# Patient Record
Sex: Female | Born: 1980 | Race: Black or African American | Hispanic: No | Marital: Married | Smoking: Never smoker
Health system: Southern US, Community
[De-identification: ages and names within clinical notes are randomized; demographics above are authoritative.]

## PROBLEM LIST (undated history)

## (undated) DIAGNOSIS — R519 Headache, unspecified: Secondary | ICD-10-CM

## (undated) DIAGNOSIS — F419 Anxiety disorder, unspecified: Secondary | ICD-10-CM

## (undated) DIAGNOSIS — B009 Herpesviral infection, unspecified: Secondary | ICD-10-CM

## (undated) HISTORY — DX: Anxiety disorder, unspecified: F41.9

## (undated) HISTORY — DX: Headache, unspecified: R51.9

---

## 2011-10-13 HISTORY — PX: EYE SURGERY: SHX253

## 2019-09-25 DIAGNOSIS — J339 Nasal polyp, unspecified: Secondary | ICD-10-CM | POA: Diagnosis not present

## 2019-09-25 DIAGNOSIS — R0981 Nasal congestion: Secondary | ICD-10-CM | POA: Diagnosis not present

## 2019-10-12 DIAGNOSIS — Z01419 Encounter for gynecological examination (general) (routine) without abnormal findings: Secondary | ICD-10-CM | POA: Diagnosis not present

## 2019-10-12 DIAGNOSIS — Z3202 Encounter for pregnancy test, result negative: Secondary | ICD-10-CM | POA: Diagnosis not present

## 2019-10-12 DIAGNOSIS — Z30432 Encounter for removal of intrauterine contraceptive device: Secondary | ICD-10-CM | POA: Diagnosis not present

## 2019-10-12 DIAGNOSIS — R635 Abnormal weight gain: Secondary | ICD-10-CM | POA: Diagnosis not present

## 2019-10-20 DIAGNOSIS — R0981 Nasal congestion: Secondary | ICD-10-CM | POA: Diagnosis not present

## 2019-10-20 DIAGNOSIS — J343 Hypertrophy of nasal turbinates: Secondary | ICD-10-CM | POA: Diagnosis not present

## 2019-10-20 DIAGNOSIS — J339 Nasal polyp, unspecified: Secondary | ICD-10-CM | POA: Diagnosis not present

## 2019-10-26 DIAGNOSIS — N871 Moderate cervical dysplasia: Secondary | ICD-10-CM | POA: Diagnosis not present

## 2019-10-26 DIAGNOSIS — D069 Carcinoma in situ of cervix, unspecified: Secondary | ICD-10-CM | POA: Diagnosis not present

## 2019-11-15 DIAGNOSIS — D069 Carcinoma in situ of cervix, unspecified: Secondary | ICD-10-CM | POA: Diagnosis not present

## 2020-01-01 DIAGNOSIS — R0981 Nasal congestion: Secondary | ICD-10-CM | POA: Diagnosis not present

## 2020-01-01 DIAGNOSIS — J019 Acute sinusitis, unspecified: Secondary | ICD-10-CM | POA: Diagnosis not present

## 2020-01-01 DIAGNOSIS — J339 Nasal polyp, unspecified: Secondary | ICD-10-CM | POA: Diagnosis not present

## 2020-01-01 DIAGNOSIS — J343 Hypertrophy of nasal turbinates: Secondary | ICD-10-CM | POA: Diagnosis not present

## 2020-02-16 DIAGNOSIS — Z20828 Contact with and (suspected) exposure to other viral communicable diseases: Secondary | ICD-10-CM | POA: Diagnosis not present

## 2020-02-16 DIAGNOSIS — Z03818 Encounter for observation for suspected exposure to other biological agents ruled out: Secondary | ICD-10-CM | POA: Diagnosis not present

## 2020-09-02 DIAGNOSIS — Z124 Encounter for screening for malignant neoplasm of cervix: Secondary | ICD-10-CM | POA: Diagnosis not present

## 2020-09-03 DIAGNOSIS — M9903 Segmental and somatic dysfunction of lumbar region: Secondary | ICD-10-CM | POA: Diagnosis not present

## 2020-09-03 DIAGNOSIS — R202 Paresthesia of skin: Secondary | ICD-10-CM | POA: Diagnosis not present

## 2020-09-03 DIAGNOSIS — M25531 Pain in right wrist: Secondary | ICD-10-CM | POA: Diagnosis not present

## 2020-09-03 DIAGNOSIS — M9906 Segmental and somatic dysfunction of lower extremity: Secondary | ICD-10-CM | POA: Diagnosis not present

## 2020-09-03 DIAGNOSIS — M25532 Pain in left wrist: Secondary | ICD-10-CM | POA: Diagnosis not present

## 2020-09-03 DIAGNOSIS — M546 Pain in thoracic spine: Secondary | ICD-10-CM | POA: Diagnosis not present

## 2020-09-03 DIAGNOSIS — M9905 Segmental and somatic dysfunction of pelvic region: Secondary | ICD-10-CM | POA: Diagnosis not present

## 2020-09-03 DIAGNOSIS — M9907 Segmental and somatic dysfunction of upper extremity: Secondary | ICD-10-CM | POA: Diagnosis not present

## 2020-09-03 DIAGNOSIS — M9902 Segmental and somatic dysfunction of thoracic region: Secondary | ICD-10-CM | POA: Diagnosis not present

## 2020-09-03 DIAGNOSIS — M9901 Segmental and somatic dysfunction of cervical region: Secondary | ICD-10-CM | POA: Diagnosis not present

## 2020-09-04 ENCOUNTER — Ambulatory Visit
Admission: RE | Admit: 2020-09-04 | Discharge: 2020-09-04 | Disposition: A | Discharge status: Home / Self Care | Payer: BC Managed Care – PPO | Source: Ambulatory Visit | Attending: Chiropractic Medicine | Admitting: Chiropractic Medicine

## 2020-09-04 ENCOUNTER — Other Ambulatory Visit: Payer: Self-pay | Admitting: Chiropractic Medicine

## 2020-09-04 ENCOUNTER — Other Ambulatory Visit: Payer: Self-pay

## 2020-09-04 DIAGNOSIS — M546 Pain in thoracic spine: Secondary | ICD-10-CM | POA: Diagnosis not present

## 2020-09-04 DIAGNOSIS — M542 Cervicalgia: Secondary | ICD-10-CM

## 2020-09-04 DIAGNOSIS — M25532 Pain in left wrist: Secondary | ICD-10-CM

## 2020-09-04 DIAGNOSIS — M25531 Pain in right wrist: Secondary | ICD-10-CM

## 2020-09-04 DIAGNOSIS — R102 Pelvic and perineal pain: Secondary | ICD-10-CM | POA: Diagnosis not present

## 2020-09-04 DIAGNOSIS — M25559 Pain in unspecified hip: Secondary | ICD-10-CM | POA: Diagnosis not present

## 2020-09-04 DIAGNOSIS — M549 Dorsalgia, unspecified: Secondary | ICD-10-CM

## 2020-09-04 DIAGNOSIS — M545 Low back pain, unspecified: Secondary | ICD-10-CM | POA: Diagnosis not present

## 2020-09-09 DIAGNOSIS — M9907 Segmental and somatic dysfunction of upper extremity: Secondary | ICD-10-CM | POA: Diagnosis not present

## 2020-09-09 DIAGNOSIS — M9901 Segmental and somatic dysfunction of cervical region: Secondary | ICD-10-CM | POA: Diagnosis not present

## 2020-09-09 DIAGNOSIS — M546 Pain in thoracic spine: Secondary | ICD-10-CM | POA: Diagnosis not present

## 2020-09-09 DIAGNOSIS — M9906 Segmental and somatic dysfunction of lower extremity: Secondary | ICD-10-CM | POA: Diagnosis not present

## 2020-09-09 DIAGNOSIS — M79641 Pain in right hand: Secondary | ICD-10-CM | POA: Diagnosis not present

## 2020-09-09 DIAGNOSIS — M9905 Segmental and somatic dysfunction of pelvic region: Secondary | ICD-10-CM | POA: Diagnosis not present

## 2020-09-09 DIAGNOSIS — M25531 Pain in right wrist: Secondary | ICD-10-CM | POA: Diagnosis not present

## 2020-09-09 DIAGNOSIS — M9902 Segmental and somatic dysfunction of thoracic region: Secondary | ICD-10-CM | POA: Diagnosis not present

## 2020-09-09 DIAGNOSIS — M5414 Radiculopathy, thoracic region: Secondary | ICD-10-CM | POA: Diagnosis not present

## 2020-09-10 DIAGNOSIS — F4323 Adjustment disorder with mixed anxiety and depressed mood: Secondary | ICD-10-CM | POA: Diagnosis not present

## 2020-09-11 DIAGNOSIS — M79641 Pain in right hand: Secondary | ICD-10-CM | POA: Diagnosis not present

## 2020-09-11 DIAGNOSIS — M9901 Segmental and somatic dysfunction of cervical region: Secondary | ICD-10-CM | POA: Diagnosis not present

## 2020-09-11 DIAGNOSIS — M25531 Pain in right wrist: Secondary | ICD-10-CM | POA: Diagnosis not present

## 2020-09-11 DIAGNOSIS — M5414 Radiculopathy, thoracic region: Secondary | ICD-10-CM | POA: Diagnosis not present

## 2020-09-11 DIAGNOSIS — M9902 Segmental and somatic dysfunction of thoracic region: Secondary | ICD-10-CM | POA: Diagnosis not present

## 2020-09-11 DIAGNOSIS — M9907 Segmental and somatic dysfunction of upper extremity: Secondary | ICD-10-CM | POA: Diagnosis not present

## 2020-09-11 DIAGNOSIS — M9903 Segmental and somatic dysfunction of lumbar region: Secondary | ICD-10-CM | POA: Diagnosis not present

## 2020-09-11 DIAGNOSIS — M546 Pain in thoracic spine: Secondary | ICD-10-CM | POA: Diagnosis not present

## 2020-09-11 DIAGNOSIS — M9906 Segmental and somatic dysfunction of lower extremity: Secondary | ICD-10-CM | POA: Diagnosis not present

## 2020-09-12 ENCOUNTER — Other Ambulatory Visit: Payer: Self-pay

## 2020-09-12 ENCOUNTER — Other Ambulatory Visit: Payer: Self-pay | Admitting: Chiropractic Medicine

## 2020-09-12 ENCOUNTER — Ambulatory Visit
Admission: RE | Admit: 2020-09-12 | Discharge: 2020-09-12 | Disposition: A | Discharge status: Home / Self Care | Payer: BC Managed Care – PPO | Source: Ambulatory Visit | Attending: Chiropractic Medicine | Admitting: Chiropractic Medicine

## 2020-09-12 DIAGNOSIS — M50121 Cervical disc disorder at C4-C5 level with radiculopathy: Secondary | ICD-10-CM | POA: Diagnosis not present

## 2020-09-12 DIAGNOSIS — M5412 Radiculopathy, cervical region: Secondary | ICD-10-CM

## 2020-09-16 DIAGNOSIS — M9903 Segmental and somatic dysfunction of lumbar region: Secondary | ICD-10-CM | POA: Diagnosis not present

## 2020-09-16 DIAGNOSIS — M25531 Pain in right wrist: Secondary | ICD-10-CM | POA: Diagnosis not present

## 2020-09-16 DIAGNOSIS — M9902 Segmental and somatic dysfunction of thoracic region: Secondary | ICD-10-CM | POA: Diagnosis not present

## 2020-09-16 DIAGNOSIS — M9901 Segmental and somatic dysfunction of cervical region: Secondary | ICD-10-CM | POA: Diagnosis not present

## 2020-09-16 DIAGNOSIS — M9907 Segmental and somatic dysfunction of upper extremity: Secondary | ICD-10-CM | POA: Diagnosis not present

## 2020-09-16 DIAGNOSIS — M9906 Segmental and somatic dysfunction of lower extremity: Secondary | ICD-10-CM | POA: Diagnosis not present

## 2020-09-16 DIAGNOSIS — M79641 Pain in right hand: Secondary | ICD-10-CM | POA: Diagnosis not present

## 2020-09-18 DIAGNOSIS — F4323 Adjustment disorder with mixed anxiety and depressed mood: Secondary | ICD-10-CM | POA: Diagnosis not present

## 2020-09-25 DIAGNOSIS — F4323 Adjustment disorder with mixed anxiety and depressed mood: Secondary | ICD-10-CM | POA: Diagnosis not present

## 2020-11-27 ENCOUNTER — Other Ambulatory Visit (HOSPITAL_COMMUNITY)
Admission: RE | Admit: 2020-11-27 | Discharge: 2020-11-27 | Disposition: A | Discharge status: Home / Self Care | Payer: BC Managed Care – PPO | Source: Ambulatory Visit | Attending: Family Medicine | Admitting: Family Medicine

## 2020-11-27 ENCOUNTER — Ambulatory Visit (INDEPENDENT_AMBULATORY_CARE_PROVIDER_SITE_OTHER): Payer: BC Managed Care – PPO | Admitting: Family Medicine

## 2020-11-27 ENCOUNTER — Encounter: Payer: Self-pay | Admitting: Family Medicine

## 2020-11-27 ENCOUNTER — Other Ambulatory Visit: Payer: Self-pay

## 2020-11-27 VITALS — BP 128/80 | HR 51 | Temp 98.7°F | Ht 62.0 in | Wt 168.0 lb

## 2020-11-27 DIAGNOSIS — Z113 Encounter for screening for infections with a predominantly sexual mode of transmission: Secondary | ICD-10-CM | POA: Diagnosis not present

## 2020-11-27 DIAGNOSIS — Z1159 Encounter for screening for other viral diseases: Secondary | ICD-10-CM | POA: Diagnosis not present

## 2020-11-27 DIAGNOSIS — Z0001 Encounter for general adult medical examination with abnormal findings: Secondary | ICD-10-CM

## 2020-11-27 DIAGNOSIS — Z114 Encounter for screening for human immunodeficiency virus [HIV]: Secondary | ICD-10-CM

## 2020-11-27 DIAGNOSIS — Z1329 Encounter for screening for other suspected endocrine disorder: Secondary | ICD-10-CM | POA: Diagnosis not present

## 2020-11-27 DIAGNOSIS — Z23 Encounter for immunization: Secondary | ICD-10-CM | POA: Diagnosis not present

## 2020-11-27 DIAGNOSIS — Z13220 Encounter for screening for lipoid disorders: Secondary | ICD-10-CM | POA: Diagnosis not present

## 2020-11-27 DIAGNOSIS — Z6830 Body mass index (BMI) 30.0-30.9, adult: Secondary | ICD-10-CM

## 2020-11-27 DIAGNOSIS — Z13228 Encounter for screening for other metabolic disorders: Secondary | ICD-10-CM | POA: Diagnosis not present

## 2020-11-27 DIAGNOSIS — Z130 Encounter for screening for diseases of the blood and blood-forming organs and certain disorders involving the immune mechanism: Secondary | ICD-10-CM | POA: Diagnosis not present

## 2020-11-27 DIAGNOSIS — Z3189 Encounter for other procreative management: Secondary | ICD-10-CM

## 2020-11-27 DIAGNOSIS — Z1231 Encounter for screening mammogram for malignant neoplasm of breast: Secondary | ICD-10-CM

## 2020-11-27 NOTE — Patient Instructions (Signed)
Take BP daily at home Let me know if > 130/80   Hypertension, Adult Hypertension is another name for high blood pressure. High blood pressure forces your heart to work harder to pump blood. This can cause problems over time. There are two numbers in a blood pressure reading. There is a top number (systolic) over a bottom number (diastolic). It is best to have a blood pressure that is below 120/80. Healthy choices can help lower your blood pressure, or you may need medicine to help lower it. What are the causes? The cause of this condition is not known. Some conditions may be related to high blood pressure. What increases the risk?  Smoking.  Having type 2 diabetes mellitus, high cholesterol, or both.  Not getting enough exercise or physical activity.  Being overweight.  Having too much fat, sugar, calories, or salt (sodium) in your diet.  Drinking too much alcohol.  Having long-term (chronic) kidney disease.  Having a family history of high blood pressure.  Age. Risk increases with age.  Race. You may be at higher risk if you are African American.  Gender. Men are at higher risk than women before age 26. After age 76, women are at higher risk than men.  Having obstructive sleep apnea.  Stress. What are the signs or symptoms?  High blood pressure may not cause symptoms. Very high blood pressure (hypertensive crisis) may cause: ? Headache. ? Feelings of worry or nervousness (anxiety). ? Shortness of breath. ? Nosebleed. ? A feeling of being sick to your stomach (nausea). ? Throwing up (vomiting). ? Changes in how you see. ? Very bad chest pain. ? Seizures. How is this treated?  This condition is treated by making healthy lifestyle changes, such as: ? Eating healthy foods. ? Exercising more. ? Drinking less alcohol.  Your health care provider may prescribe medicine if lifestyle changes are not enough to get your blood pressure under control, and if: ? Your top  number is above 130. ? Your bottom number is above 80.  Your personal target blood pressure may vary. Follow these instructions at home: Eating and drinking  If told, follow the DASH eating plan. To follow this plan: ? Fill one half of your plate at each meal with fruits and vegetables. ? Fill one fourth of your plate at each meal with whole grains. Whole grains include whole-wheat pasta, brown rice, and whole-grain bread. ? Eat or drink low-fat dairy products, such as skim milk or low-fat yogurt. ? Fill one fourth of your plate at each meal with low-fat (lean) proteins. Low-fat proteins include fish, chicken without skin, eggs, beans, and tofu. ? Avoid fatty meat, cured and processed meat, or chicken with skin. ? Avoid pre-made or processed food.  Eat less than 1,500 mg of salt each day.  Do not drink alcohol if: ? Your doctor tells you not to drink. ? You are pregnant, may be pregnant, or are planning to become pregnant.  If you drink alcohol: ? Limit how much you use to:  0-1 drink a day for women.  0-2 drinks a day for men. ? Be aware of how much alcohol is in your drink. In the [place] one drink equals one 12 oz bottle of beer (355 mL), one 5 oz glass of wine (148 mL), or one 1 oz glass of hard liquor (44 mL).   Lifestyle  Work with your doctor to stay at a healthy weight or to lose weight. Ask your doctor what the best  weight is for you.  Get at least 30 minutes of exercise most days of the week. This may include walking, swimming, or biking.  Get at least 30 minutes of exercise that strengthens your muscles (resistance exercise) at least 3 days a week. This may include lifting weights or doing Pilates.  Do not use any products that contain nicotine or tobacco, such as cigarettes, e-cigarettes, and chewing tobacco. If you need help quitting, ask your doctor.  Check your blood pressure at home as told by your doctor.  Keep all follow-up visits as told by your doctor. This  is important.   Medicines  Take over-the-counter and prescription medicines only as told by your doctor. Follow directions carefully.  Do not skip doses of blood pressure medicine. The medicine does not work as well if you skip doses. Skipping doses also puts you at risk for problems.  Ask your doctor about side effects or reactions to medicines that you should watch for. Contact a doctor if you:  Think you are having a reaction to the medicine you are taking.  Have headaches that keep coming back (recurring).  Feel dizzy.  Have swelling in your ankles.  Have trouble with your vision. Get help right away if you:  Get a very bad headache.  Start to feel mixed up (confused).  Feel weak or numb.  Feel faint.  Have very bad pain in your: ? Chest. ? Belly (abdomen).  Throw up more than once.  Have trouble breathing. Summary  Hypertension is another name for high blood pressure.  High blood pressure forces your heart to work harder to pump blood.  For most people, a normal blood pressure is less than 120/80.  Making healthy choices can help lower blood pressure. If your blood pressure does not get lower with healthy choices, you may need to take medicine. This information is not intended to replace advice given to you by your health care provider. Make sure you discuss any questions you have with your health care provider. Document Revised: 06/08/2018 Document Reviewed: 06/08/2018 Elsevier Patient Education  2021 [employer].

## 2020-11-27 NOTE — Progress Notes (Addendum)
2/16/20224:14 PM  Francisca Langenderfer 03/13/1981, 40 y.o., female [id number]  Chief Complaint  Patient presents with  . Establish Care    Last physical 5 yrs ago , voices no concerns     HPI:   Patient is a 40 y.o. female with past medical history significant for abnormal pap who presents today for new patient eval.  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 TETANUS/TDAP  Never done  . INFLUENZA VACCINE  01/09/2021 (Originally 05/12/2020)  . PAP SMEAR-Modifier  11/06/2023  . COVID-19 Vaccine  Completed  . Hepatitis C Screening  Completed  . HIV Screening  Completed     Depression screen [facility] 2/9 11/27/2020  Decreased Interest 0  Down, Depressed, Hopeless 0  PHQ - 2 Score 0    Fall Risk  11/27/2020  Falls in the past year? 0  Number falls in past yr: 0  Injury with Fall? 0  Follow up Falls evaluation completed     Not on File  Prior to Admission medications   Medication Sig Start Date End Date Taking? Authorizing Provider  Ascorbic Acid (VITAMIN C) 1000 MG tablet Take 1,000 mg by mouth daily.   Yes [provider]  Calcium-Magnesium-Zinc (CALCIUM-MAGNESUIUM-ZINC PO) Take by mouth.   Yes [provider]  cholecalciferol (VITAMIN D3) 25 MCG (1000 UNIT) tablet Take 1,000 Units by mouth daily.   Yes [provider]  NON FORMULARY Supplement Quuercetin   Yes [provider]    History reviewed. No pertinent past medical history.  Past Surgical History:  Procedure Laterality Date  . EYE SURGERY  2013   cyst removal left eye    Social History   Tobacco  Use  . Smoking status: Never Smoker  . Smokeless tobacco: Never Used  Substance Use Topics  . Alcohol use: Not Currently    History reviewed. No pertinent family history.  Review of Systems  Constitutional: Negative for chills, fever and malaise/fatigue.  Eyes: Negative for blurred vision and double vision.  Respiratory: Negative for cough, shortness of breath and wheezing.   Cardiovascular: Negative for chest pain, palpitations and leg swelling.  Gastrointestinal: Negative for abdominal pain, blood in stool, constipation, diarrhea, heartburn, nausea and vomiting.  Genitourinary: Negative for dysuria, frequency and hematuria.  Musculoskeletal: Negative for back pain and joint pain.  Skin: Negative for rash.  Neurological: Negative for dizziness, weakness and headaches.     OBJECTIVE:  Today's Vitals   11/27/20 1536  BP: (!) 150/99  Pulse: (!) 51  Temp: 98.7 F (37.1 C)  SpO2: 100%  Weight: 168 lb (76.2 kg)  Height: _0  (1.575 m)   Body mass index is 30.73 kg/m.   Physical Exam Constitutional:      General: She is not in acute distress.    Appearance: Normal appearance. She is not ill-appearing.  HENT:     Head: Normocephalic.  Cardiovascular:     Rate and Rhythm: Normal rate and regular rhythm.     Pulses: Normal pulses.     Heart sounds: Normal heart sounds. No murmur heard. No friction rub. No gallop.  Pulmonary:     Effort: Pulmonary effort is normal. No respiratory distress.     Breath sounds: Normal breath sounds. No stridor. No wheezing, rhonchi or rales.  Abdominal:     General: Bowel sounds are normal.     Palpations: Abdomen is soft.     Tenderness: There is no abdominal tenderness.  Musculoskeletal:     Right lower leg: No edema.     Left lower leg: No edema.  Skin:    General: Skin is warm and dry.  Neurological:     Mental Status: She is alert and oriented to person, place, and time.  Psychiatric:        Mood and Affect: Mood normal.         Behavior: Behavior normal.     No results found for this or any previous visit (from the past 24 hour(s)).  No results found.   ASSESSMENT and PLAN  Problem List Items Addressed This Visit   None   Visit Diagnoses    Screening for endocrine, metabolic and immunity disorder    -  Primary   Relevant Orders   CBC with Differential   TSH   Hemoglobin A1c   Vitamin D, 25-hydroxy   BMI 30.0-30.9,adult       Encounter for vaccination       Relevant Orders   Tdap vaccine greater than or equal to 7yo IM   Screening, lipid       Relevant Orders   CMP14+EGFR   Lipid Panel   Screening for HIV (human immunodeficiency virus)       Relevant Orders   HIV Antibody (routine testing w rflx)   Encounter for hepatitis C screening test for low risk patient       Relevant Orders   HCV Ab w/Rflx to Verification   Encounter for fertility planning       Relevant Orders   Ambulatory referral to Endocrinology   Screening mammogram for breast cancer       Relevant Orders   MM DIAG BREAST TOMO BILATERAL   Routine screening for STI (sexually transmitted infection)       Relevant Orders   RPR   Urine cytology ancillary only       Plan Take BP daily at home,Let me know if > 130/80 Declines wanting to start BP at this time , r/se/b discussed Will follow up with lab results Order placed for mammogram to get when turns 40 Referral placed to [facility]    Return in about 3 months (around 02/24/2021).    Huston Foley Kaitlynd Phillips, FNP-BC [facility] at [facility] [place], [place] [address] Ph.  [phone] Fax [phone]

## 2020-11-28 ENCOUNTER — Telehealth: Payer: Self-pay

## 2020-11-28 DIAGNOSIS — Z113 Encounter for screening for infections with a predominantly sexual mode of transmission: Secondary | ICD-10-CM | POA: Diagnosis not present

## 2020-11-28 LAB — CMP14+EGFR
ALT: 13 IU/L (ref 0–32)
AST: 19 IU/L (ref 0–40)
Albumin/Globulin Ratio: 1.5 (ref 1.2–2.2)
Albumin: 4.2 g/dL (ref 3.8–4.8)
Alkaline Phosphatase: 56 IU/L (ref 44–121)
BUN/Creatinine Ratio: 8 — ABNORMAL LOW (ref 9–23)
BUN: 6 mg/dL (ref 6–20)
Bilirubin Total: 0.2 mg/dL (ref 0.0–1.2)
CO2: 22 mmol/L (ref 20–29)
Calcium: 9.6 mg/dL (ref 8.7–10.2)
Chloride: 103 mmol/L (ref 96–106)
Creatinine, Ser: 0.8 mg/dL (ref 0.57–1.00)
GFR calc Af Amer: 107 mL/min/{1.73_m2} (ref >59)
GFR calc non Af Amer: 93 mL/min/{1.73_m2} (ref >59)
Globulin, Total: 2.8 g/dL (ref 1.5–4.5)
Glucose: 86 mg/dL (ref 65–99)
Potassium: 4.2 mmol/L (ref 3.5–5.2)
Sodium: 141 mmol/L (ref 134–144)
Total Protein: 7 g/dL (ref 6.0–8.5)

## 2020-11-28 LAB — CBC WITH DIFFERENTIAL/PLATELET
Basophils Absolute: 0.1 10*3/uL (ref 0.0–0.2)
Basos: 1 %
EOS (ABSOLUTE): 0.2 10*3/uL (ref 0.0–0.4)
Eos: 3 %
Hematocrit: 40 % (ref 34.0–46.6)
Hemoglobin: 13.1 g/dL (ref 11.1–15.9)
Immature Grans (Abs): 0 10*3/uL (ref 0.0–0.1)
Immature Granulocytes: 0 %
Lymphocytes Absolute: 3.6 10*3/uL — ABNORMAL HIGH (ref 0.7–3.1)
Lymphs: 56 %
MCH: 29.2 pg (ref 26.6–33.0)
MCHC: 32.8 g/dL (ref 31.5–35.7)
MCV: 89 fL (ref 79–97)
Monocytes Absolute: 0.2 10*3/uL (ref 0.1–0.9)
Monocytes: 4 %
Neutrophils Absolute: 2.3 10*3/uL (ref 1.4–7.0)
Neutrophils: 36 %
Platelets: 238 10*3/uL (ref 150–450)
RBC: 4.48 x10E6/uL (ref 3.77–5.28)
RDW: 12.2 % (ref 11.7–15.4)
WBC: 6.3 10*3/uL (ref 3.4–10.8)

## 2020-11-28 LAB — HCV INTERPRETATION

## 2020-11-28 LAB — LIPID PANEL
Chol/HDL Ratio: 3.6 ratio (ref 0.0–4.4)
Cholesterol, Total: 204 mg/dL — ABNORMAL HIGH (ref 100–199)
HDL: 57 mg/dL (ref >39)
LDL Chol Calc (NIH): 129 mg/dL — ABNORMAL HIGH (ref 0–99)
Triglycerides: 99 mg/dL (ref 0–149)
VLDL Cholesterol Cal: 18 mg/dL (ref 5–40)

## 2020-11-28 LAB — HIV ANTIBODY (ROUTINE TESTING W REFLEX): HIV Screen 4th Generation wRfx: NONREACTIVE

## 2020-11-28 LAB — HEMOGLOBIN A1C
Est. average glucose Bld gHb Est-mCnc: 103 mg/dL
Hgb A1c MFr Bld: 5.2 % (ref 4.8–5.6)

## 2020-11-28 LAB — TSH: TSH: 3.31 u[IU]/mL (ref 0.450–4.500)

## 2020-11-28 LAB — RPR: RPR Ser Ql: NONREACTIVE

## 2020-11-28 LAB — HCV AB W/RFLX TO VERIFICATION: HCV Ab: <0.1 s/co ratio (ref 0.0–0.9)

## 2020-11-28 LAB — VITAMIN D 25 HYDROXY (VIT D DEFICIENCY, FRACTURES): Vit D, 25-Hydroxy: 26.2 ng/mL — ABNORMAL LOW (ref 30.0–100.0)

## 2020-11-28 NOTE — Progress Notes (Signed)
Overall your labs look good. Your Vitamin D continues to be low. Continue your OTC supplement with food daily. Your cholesterol levels are also slightly elevated. No medications are needed at this time, but continue to work on improving your diet and increasing exercise as able. Let me know if you would like a referral to a dietician to further discuss this.

## 2020-11-29 LAB — URINE CYTOLOGY ANCILLARY ONLY
Bacterial Vaginitis-Urine: NEGATIVE
Candida Urine: NEGATIVE
Chlamydia: NEGATIVE
Comment: NEGATIVE
Comment: NEGATIVE
Comment: NORMAL
Neisseria Gonorrhea: NEGATIVE
Trichomonas: NEGATIVE

## 2020-11-29 NOTE — Progress Notes (Signed)
STI testing negative

## 2020-12-23 DIAGNOSIS — Z20822 Contact with and (suspected) exposure to covid-19: Secondary | ICD-10-CM | POA: Diagnosis not present

## 2021-02-27 ENCOUNTER — Ambulatory Visit: Payer: BC Managed Care – PPO | Admitting: Family Medicine

## 2021-04-19 DIAGNOSIS — Z331 Pregnant state, incidental: Secondary | ICD-10-CM | POA: Diagnosis not present

## 2021-05-01 DIAGNOSIS — Z3201 Encounter for pregnancy test, result positive: Secondary | ICD-10-CM | POA: Diagnosis not present

## 2021-05-01 DIAGNOSIS — Z3480 Encounter for supervision of other normal pregnancy, unspecified trimester: Secondary | ICD-10-CM | POA: Diagnosis not present

## 2021-05-01 DIAGNOSIS — N925 Other specified irregular menstruation: Secondary | ICD-10-CM | POA: Diagnosis not present

## 2021-05-16 DIAGNOSIS — Z3480 Encounter for supervision of other normal pregnancy, unspecified trimester: Secondary | ICD-10-CM | POA: Diagnosis not present

## 2021-05-16 DIAGNOSIS — Z369 Encounter for antenatal screening, unspecified: Secondary | ICD-10-CM | POA: Diagnosis not present

## 2021-05-16 DIAGNOSIS — Z3490 Encounter for supervision of normal pregnancy, unspecified, unspecified trimester: Secondary | ICD-10-CM | POA: Diagnosis not present

## 2021-05-16 DIAGNOSIS — Z113 Encounter for screening for infections with a predominantly sexual mode of transmission: Secondary | ICD-10-CM | POA: Diagnosis not present

## 2021-05-16 LAB — OB RESULTS CONSOLE RUBELLA ANTIBODY, IGM: Rubella: IMMUNE

## 2021-05-16 LAB — OB RESULTS CONSOLE HEPATITIS B SURFACE ANTIGEN: Hepatitis B Surface Ag: NEGATIVE

## 2021-05-16 LAB — OB RESULTS CONSOLE GC/CHLAMYDIA
Chlamydia: NEGATIVE
Gonorrhea: NEGATIVE

## 2021-05-16 LAB — OB RESULTS CONSOLE ANTIBODY SCREEN: Antibody Screen: NEGATIVE

## 2021-05-16 LAB — OB RESULTS CONSOLE ABO/RH: RH Type: POSITIVE

## 2021-05-16 LAB — OB RESULTS CONSOLE HIV ANTIBODY (ROUTINE TESTING): HIV: NONREACTIVE

## 2021-05-16 LAB — HEPATITIS C ANTIBODY: HCV Ab: NEGATIVE

## 2021-05-16 LAB — OB RESULTS CONSOLE RPR: RPR: NONREACTIVE

## 2021-06-02 ENCOUNTER — Other Ambulatory Visit: Payer: Self-pay | Admitting: Obstetrics

## 2021-06-02 DIAGNOSIS — Z363 Encounter for antenatal screening for malformations: Secondary | ICD-10-CM

## 2021-06-12 DIAGNOSIS — Z369 Encounter for antenatal screening, unspecified: Secondary | ICD-10-CM | POA: Diagnosis not present

## 2021-07-08 ENCOUNTER — Encounter: Payer: Self-pay | Admitting: *Deleted

## 2021-07-09 ENCOUNTER — Ambulatory Visit: Payer: BC Managed Care – PPO | Attending: Obstetrics and Gynecology

## 2021-07-09 ENCOUNTER — Ambulatory Visit: Payer: BC Managed Care – PPO | Admitting: *Deleted

## 2021-07-09 ENCOUNTER — Encounter: Payer: Self-pay | Admitting: *Deleted

## 2021-07-09 ENCOUNTER — Other Ambulatory Visit: Payer: Self-pay

## 2021-07-09 VITALS — BP 111/58 | HR 75

## 2021-07-09 DIAGNOSIS — O09512 Supervision of elderly primigravida, second trimester: Secondary | ICD-10-CM

## 2021-07-09 DIAGNOSIS — Z363 Encounter for antenatal screening for malformations: Secondary | ICD-10-CM

## 2021-07-10 ENCOUNTER — Other Ambulatory Visit: Payer: Self-pay | Admitting: *Deleted

## 2021-07-10 DIAGNOSIS — O09522 Supervision of elderly multigravida, second trimester: Secondary | ICD-10-CM

## 2021-07-10 DIAGNOSIS — Z362 Encounter for other antenatal screening follow-up: Secondary | ICD-10-CM

## 2021-07-10 DIAGNOSIS — Z6830 Body mass index (BMI) 30.0-30.9, adult: Secondary | ICD-10-CM

## 2021-07-17 DIAGNOSIS — Z23 Encounter for immunization: Secondary | ICD-10-CM | POA: Diagnosis not present

## 2021-08-08 ENCOUNTER — Ambulatory Visit: Payer: BC Managed Care – PPO | Attending: Obstetrics and Gynecology

## 2021-08-08 ENCOUNTER — Ambulatory Visit: Payer: BC Managed Care – PPO | Admitting: *Deleted

## 2021-08-08 ENCOUNTER — Encounter: Payer: Self-pay | Admitting: *Deleted

## 2021-08-08 ENCOUNTER — Other Ambulatory Visit: Payer: Self-pay

## 2021-08-08 VITALS — BP 114/68 | HR 69

## 2021-08-08 DIAGNOSIS — E669 Obesity, unspecified: Secondary | ICD-10-CM | POA: Diagnosis not present

## 2021-08-08 DIAGNOSIS — O09522 Supervision of elderly multigravida, second trimester: Secondary | ICD-10-CM | POA: Diagnosis not present

## 2021-08-08 DIAGNOSIS — Z362 Encounter for other antenatal screening follow-up: Secondary | ICD-10-CM | POA: Insufficient documentation

## 2021-08-08 DIAGNOSIS — Z3A23 23 weeks gestation of pregnancy: Secondary | ICD-10-CM

## 2021-08-08 DIAGNOSIS — O99212 Obesity complicating pregnancy, second trimester: Secondary | ICD-10-CM | POA: Diagnosis not present

## 2021-08-08 DIAGNOSIS — O09512 Supervision of elderly primigravida, second trimester: Secondary | ICD-10-CM | POA: Insufficient documentation

## 2021-08-08 DIAGNOSIS — Z6830 Body mass index (BMI) 30.0-30.9, adult: Secondary | ICD-10-CM | POA: Insufficient documentation

## 2021-08-14 DIAGNOSIS — Z369 Encounter for antenatal screening, unspecified: Secondary | ICD-10-CM | POA: Diagnosis not present

## 2021-09-11 DIAGNOSIS — Z369 Encounter for antenatal screening, unspecified: Secondary | ICD-10-CM | POA: Diagnosis not present

## 2021-09-11 DIAGNOSIS — Z3480 Encounter for supervision of other normal pregnancy, unspecified trimester: Secondary | ICD-10-CM | POA: Diagnosis not present

## 2021-10-12 NOTE — L&D Delivery Note (Signed)
Delivery Note   Pt reached complete dilation and pushed very well for about 30 minutes and at 12:25 AM a healthy female was delivered via Vaginal, Spontaneous (Presentation: Right Occiput Anterior).  APGAR: 9, 9; weight  .   Placenta status: Spontaneous, Intact.  Cord: 3 vessels with the following complications: Nuchal x 1  Anesthesia: Epidural Episiotomy: None Lacerations: 2nd degree;Perineal;Periurethral Suture Repair: 3.0 vicryl rapide Est. Blood Loss (mL): 320  Mom to postpartum.  Baby to Couplet care / Skin to Skin.  D/w pt circumcision and they desire to proceed.  Oliver Pila 12/03/2021, 1:06 AM

## 2021-11-05 LAB — OB RESULTS CONSOLE GBS: GBS: NEGATIVE

## 2021-11-25 ENCOUNTER — Telehealth (HOSPITAL_COMMUNITY): Payer: Self-pay | Admitting: *Deleted

## 2021-11-25 NOTE — Telephone Encounter (Signed)
Preadmission screen  

## 2021-11-26 ENCOUNTER — Other Ambulatory Visit: Payer: Self-pay | Admitting: Advanced Practice Midwife

## 2021-11-27 ENCOUNTER — Inpatient Hospital Stay (HOSPITAL_COMMUNITY): Payer: BC Managed Care – PPO

## 2021-11-28 ENCOUNTER — Other Ambulatory Visit: Payer: Self-pay | Admitting: Obstetrics and Gynecology

## 2021-11-28 ENCOUNTER — Encounter (HOSPITAL_COMMUNITY): Payer: Self-pay | Admitting: *Deleted

## 2021-11-28 ENCOUNTER — Telehealth (HOSPITAL_COMMUNITY): Payer: Self-pay | Admitting: *Deleted

## 2021-11-28 NOTE — Telephone Encounter (Signed)
Preadmission screen  

## 2021-12-01 ENCOUNTER — Encounter (HOSPITAL_COMMUNITY): Payer: Self-pay | Admitting: Obstetrics and Gynecology

## 2021-12-01 ENCOUNTER — Inpatient Hospital Stay (HOSPITAL_COMMUNITY): Payer: BC Managed Care – PPO

## 2021-12-01 ENCOUNTER — Inpatient Hospital Stay (HOSPITAL_COMMUNITY)
Admission: AD | Admit: 2021-12-01 | Discharge: 2021-12-04 | DRG: 807 | Disposition: A | Discharge status: Home / Self Care | Payer: BC Managed Care – PPO | Attending: Obstetrics and Gynecology | Admitting: Obstetrics and Gynecology

## 2021-12-01 DIAGNOSIS — Z20822 Contact with and (suspected) exposure to covid-19: Secondary | ICD-10-CM | POA: Diagnosis present

## 2021-12-01 DIAGNOSIS — Z3A40 40 weeks gestation of pregnancy: Secondary | ICD-10-CM | POA: Diagnosis not present

## 2021-12-01 DIAGNOSIS — O09513 Supervision of elderly primigravida, third trimester: Secondary | ICD-10-CM | POA: Diagnosis present

## 2021-12-01 DIAGNOSIS — Z3493 Encounter for supervision of normal pregnancy, unspecified, third trimester: Secondary | ICD-10-CM | POA: Diagnosis present

## 2021-12-01 HISTORY — DX: Herpesviral infection, unspecified: B00.9

## 2021-12-01 LAB — CBC
HCT: 34.8 % — ABNORMAL LOW (ref 36.0–46.0)
Hemoglobin: 12.1 g/dL (ref 12.0–15.0)
MCH: 29.4 pg (ref 26.0–34.0)
MCHC: 34.8 g/dL (ref 30.0–36.0)
MCV: 84.7 fL (ref 80.0–100.0)
Platelets: 159 10*3/uL (ref 150–400)
RBC: 4.11 MIL/uL (ref 3.87–5.11)
RDW: 14.6 % (ref 11.5–15.5)
WBC: 5.7 10*3/uL (ref 4.0–10.5)
nRBC: 0 % (ref 0.0–0.2)

## 2021-12-01 LAB — RESP PANEL BY RT-PCR (FLU A&B, COVID) ARPGX2
Influenza A by PCR: NEGATIVE
Influenza B by PCR: NEGATIVE
SARS Coronavirus 2 by RT PCR: NEGATIVE

## 2021-12-01 LAB — TYPE AND SCREEN
ABO/RH(D): O POS
Antibody Screen: NEGATIVE

## 2021-12-01 MED ORDER — OXYTOCIN BOLUS FROM INFUSION
333.0000 mL | Freq: Once | INTRAVENOUS | Status: AC
  Administered 2021-12-03: 333 mL via INTRAVENOUS

## 2021-12-01 MED ORDER — MISOPROSTOL 25 MCG QUARTER TABLET
25.0000 ug | ORAL_TABLET | ORAL | Status: DC | PRN
  Administered 2021-12-01 – 2021-12-02 (×4): 25 ug via VAGINAL
  Filled 2021-12-01 (×6): qty 1

## 2021-12-01 MED ORDER — SOD CITRATE-CITRIC ACID 500-334 MG/5ML PO SOLN: 30.0000 mL | ORAL | Status: DC | PRN

## 2021-12-01 MED ORDER — TERBUTALINE SULFATE 1 MG/ML IJ SOLN: 0.2500 mg | Freq: Once | INTRAMUSCULAR | Status: DC | PRN

## 2021-12-01 MED ORDER — ACETAMINOPHEN 325 MG PO TABS: 650.0000 mg | ORAL_TABLET | ORAL | Status: DC | PRN

## 2021-12-01 MED ORDER — ONDANSETRON HCL 4 MG/2ML IJ SOLN
4.0000 mg | Freq: Four times a day (QID) | INTRAMUSCULAR | Status: DC | PRN
  Administered 2021-12-03: 4 mg via INTRAVENOUS
  Filled 2021-12-01: qty 2

## 2021-12-01 MED ORDER — LACTATED RINGERS IV SOLN: 500.0000 mL | INTRAVENOUS | Status: DC | PRN

## 2021-12-01 MED ORDER — LIDOCAINE HCL (PF) 1 % IJ SOLN: 30.0000 mL | INTRAMUSCULAR | Status: DC | PRN

## 2021-12-01 MED ORDER — OXYCODONE-ACETAMINOPHEN 5-325 MG PO TABS: 2.0000 | ORAL_TABLET | ORAL | Status: DC | PRN

## 2021-12-01 MED ORDER — OXYCODONE-ACETAMINOPHEN 5-325 MG PO TABS: 1.0000 | ORAL_TABLET | ORAL | Status: DC | PRN

## 2021-12-01 MED ORDER — FENTANYL CITRATE (PF) 100 MCG/2ML IJ SOLN: 50.0000 ug | INTRAMUSCULAR | Status: DC | PRN

## 2021-12-01 MED ORDER — LACTATED RINGERS IV SOLN: INTRAVENOUS | Status: DC

## 2021-12-01 MED ORDER — OXYTOCIN-SODIUM CHLORIDE 30-0.9 UT/500ML-% IV SOLN
2.5000 [IU]/h | INTRAVENOUS | Status: DC
  Administered 2021-12-03: 2.5 [IU]/h via INTRAVENOUS
  Filled 2021-12-01: qty 500

## 2021-12-01 NOTE — H&P (Signed)
Brandi Small is a 41 y.o. female presenting for IOL for AMA  41 yo G1P0 @ 39+5 presents for IOL for AMA. Her pregnancy has been otherwise uncomplicated OB History     Gravida  1   Para      Term      Preterm      AB      Living         SAB      IAB      Ectopic      Multiple      Live Births             Past Medical History:  Diagnosis Date   Anxiety    Headache    HSV (herpes simplex virus) infection    Past Surgical History:  Procedure Laterality Date   EYE SURGERY  2013   cyst removal left eye   Family History: family history is not on file. Social History:  reports that she has never smoked. She has never used smokeless tobacco. She reports that she does not currently use alcohol. She reports that she does not use drugs.     Maternal Diabetes: No Genetic Screening: Declined Maternal Ultrasounds/Referrals: Normal Fetal Ultrasounds or other Referrals:  None Maternal Substance Abuse:  No Significant Maternal Medications:  None Significant Maternal Lab Results:  Group B Strep negative Other Comments:  None  Review of Systems History Dilation: 1 Effacement (%): 30 Station: -3 Exam by:: Mary [place] Johnson, RN Blood pressure 123/76, pulse 75, temperature 97.7 F (36.5 C), temperature source Oral, resp. rate 16, height 5\' 2"  (1.575 m), weight 81.5 kg, last menstrual period 02/16/2021. Exam Physical Exam  Prenatal labs: ABO, Rh: --/--/O POS (02/20 1325) Antibody: NEG (02/20 1325) Rubella: Immune (08/05 0000) RPR: Nonreactive (08/05 0000)  HBsAg: Negative (08/05 0000)  HIV: Non-reactive (08/05 0000)  GBS: Negative/-- (01/25 0000)   Assessment/Plan: 1) Admit 2) Misoprostal [birth year]1982 Q 4 PV 3) Epidural on request   12/01/2021, 4:21 PM

## 2021-12-02 ENCOUNTER — Inpatient Hospital Stay (HOSPITAL_COMMUNITY): Payer: BC Managed Care – PPO | Admitting: Anesthesiology

## 2021-12-02 ENCOUNTER — Other Ambulatory Visit: Payer: Self-pay

## 2021-12-02 LAB — RPR: RPR Ser Ql: NONREACTIVE

## 2021-12-02 MED ORDER — PHENYLEPHRINE 40 MCG/ML (10ML) SYRINGE FOR IV PUSH (FOR BLOOD PRESSURE SUPPORT)
80.0000 ug | PREFILLED_SYRINGE | INTRAVENOUS | Status: AC | PRN
  Administered 2021-12-02 (×3): 80 ug via INTRAVENOUS

## 2021-12-02 MED ORDER — FENTANYL-BUPIVACAINE-NACL 0.5-0.125-0.9 MG/250ML-% EP SOLN
12.0000 mL/h | EPIDURAL | Status: DC | PRN
  Administered 2021-12-02: 12 mL/h via EPIDURAL
  Filled 2021-12-02: qty 250

## 2021-12-02 MED ORDER — LACTATED RINGERS AMNIOINFUSION: INTRAVENOUS | Status: DC

## 2021-12-02 MED ORDER — OXYTOCIN-SODIUM CHLORIDE 30-0.9 UT/500ML-% IV SOLN
1.0000 m[IU]/min | INTRAVENOUS | Status: DC
  Administered 2021-12-02: 2 m[IU]/min via INTRAVENOUS

## 2021-12-02 MED ORDER — LACTATED RINGERS IV SOLN: 500.0000 mL | Freq: Once | INTRAVENOUS | Status: DC

## 2021-12-02 MED ORDER — BUPIVACAINE HCL (PF) 0.25 % IJ SOLN
INTRAMUSCULAR | Status: DC | PRN
  Administered 2021-12-02: 10 mL via EPIDURAL

## 2021-12-02 MED ORDER — PHENYLEPHRINE 40 MCG/ML (10ML) SYRINGE FOR IV PUSH (FOR BLOOD PRESSURE SUPPORT)
80.0000 ug | PREFILLED_SYRINGE | INTRAVENOUS | Status: DC | PRN
  Filled 2021-12-02: qty 10

## 2021-12-02 MED ORDER — EPHEDRINE 5 MG/ML INJ: 10.0000 mg | INTRAVENOUS | Status: DC | PRN

## 2021-12-02 MED ORDER — DIPHENHYDRAMINE HCL 50 MG/ML IJ SOLN: 12.5000 mg | INTRAMUSCULAR | Status: DC | PRN

## 2021-12-02 MED ORDER — LIDOCAINE HCL (PF) 1 % IJ SOLN
INTRAMUSCULAR | Status: DC | PRN
  Administered 2021-12-02: 8 mL via EPIDURAL

## 2021-12-02 NOTE — Progress Notes (Signed)
Patient ID: Brandi Small, female   DOB: May 02, 1981, 41 y.o.   MRN: [id number] Pt was having some repetitive deep variables with contractions and one prolonged.  Responded to position change to high fowler's and amnioinfusion begun.  Now with good variability and only mild intermittent variable decelerations with contractions  Cervix 80/6-7/-1 to 0  Continue to follow progress.

## 2021-12-02 NOTE — Progress Notes (Signed)
Patient ID: Brandi Small, female   DOB: Nov 21, 1980, 41 y.o.   MRN: [id number] Pt comfortable with epidural  Afeb VSS FHR 140 baseline and good variability  Cervix  c/8-9/-1 to 0 station with contraction Continue to follow progress

## 2021-12-02 NOTE — Anesthesia Procedure Notes (Signed)
Epidural Patient location during procedure: OB Start time: 12/02/2021 12:22 PM End time: 12/02/2021 12:29 PM  Staffing Anesthesiologist: Bethena Midget, MD  Preanesthetic Checklist Completed: patient identified, IV checked, site marked, risks and benefits discussed, surgical consent, monitors and equipment checked, pre-op evaluation and timeout performed  Epidural Patient position: sitting Prep: DuraPrep and site prepped and draped Patient monitoring: continuous pulse ox and blood pressure Approach: midline Location: L3-L4 Injection technique: LOR air  Needle:  Needle type: Tuohy  Needle gauge: 17 G Needle length: 9 cm and 9 Needle insertion depth: 5 cm cm Catheter type: closed end flexible Catheter size: 19 Gauge Catheter at skin depth: 13 cm Test dose: negative  Assessment Events: blood not aspirated, injection not painful, no injection resistance, no paresthesia and negative IV test

## 2021-12-02 NOTE — Progress Notes (Signed)
Patient ID: Brandi Small, female   DOB: 02/10/1981, 41 y.o.   MRN: [id number] Late entry note  Pt was given several doses of cytotec overnight and declined a foley balloon When I came on this AM we started pitocin and pt began to feel increasingly intense contractions. I examined her at 1130am and she was 2/70-80/-2, AROM performed with clear fluid noted  She became uncomfortable and got her epidural.  Cervix after epidural was 5/80/-2 at 1300pm  FHR overall category 1 with occasional mild variable decelerations

## 2021-12-02 NOTE — Anesthesia Preprocedure Evaluation (Addendum)
Anesthesia Evaluation  Patient identified by MRN, date of birth, ID band Patient awake    Reviewed: Allergy & Precautions, NPO status , Patient's Chart, lab work & pertinent test results  Airway Mallampati: I       Dental  (+) Teeth Intact, Dental Advisory Given   Pulmonary neg pulmonary ROS,    Pulmonary exam normal        Cardiovascular negative cardio ROS Normal cardiovascular exam     Neuro/Psych negative neurological ROS  negative psych ROS   GI/Hepatic negative GI ROS, Neg liver ROS,   Endo/Other  negative endocrine ROS  Renal/GU negative Renal ROS  negative genitourinary   Musculoskeletal negative musculoskeletal ROS (+)   Abdominal (+) + obese,   Peds  Hematology negative hematology ROS (+)   Anesthesia Other Findings IOL for AMA  Reproductive/Obstetrics (+) Pregnancy                           Anesthesia Physical Anesthesia Plan  ASA: 2  Anesthesia Plan: Epidural   Post-op Pain Management:    Induction:   PONV Risk Score and Plan: Treatment may vary due to age or medical condition  Airway Management Planned: Natural Airway  Additional Equipment: None  Intra-op Plan:   Post-operative Plan:   Informed Consent: I have reviewed the patients History and Physical, chart, labs and discussed the procedure including the risks, benefits and alternatives for the proposed anesthesia with the patient or authorized representative who has indicated his/her understanding and acceptance.       Plan Discussed with: Anesthesiologist and CRNA  Anesthesia Plan Comments: (Patient identified. Risks, benefits, options discussed with patient including but not limited to bleeding, infection, nerve damage, paralysis, failed block, incomplete pain control, headache, blood pressure changes, nausea, vomiting, reactions to medication, itching, and post partum back pain. Confirmed with bedside  nurse the patient's most recent platelet count. Confirmed with the patient that they are not taking any anticoagulation, have any bleeding history or any family history of bleeding disorders. Patient expressed understanding and wishes to proceed. All questions were answered. )       Anesthesia Quick Evaluation

## 2021-12-03 ENCOUNTER — Encounter (HOSPITAL_COMMUNITY): Payer: Self-pay | Admitting: Obstetrics and Gynecology

## 2021-12-03 LAB — CBC
HCT: 30.1 % — ABNORMAL LOW (ref 36.0–46.0)
Hemoglobin: 10.3 g/dL — ABNORMAL LOW (ref 12.0–15.0)
MCH: 29.1 pg (ref 26.0–34.0)
MCHC: 34.2 g/dL (ref 30.0–36.0)
MCV: 85 fL (ref 80.0–100.0)
Platelets: 144 10*3/uL — ABNORMAL LOW (ref 150–400)
RBC: 3.54 MIL/uL — ABNORMAL LOW (ref 3.87–5.11)
RDW: 14.6 % (ref 11.5–15.5)
WBC: 14.6 10*3/uL — ABNORMAL HIGH (ref 4.0–10.5)
nRBC: 0 % (ref 0.0–0.2)

## 2021-12-03 MED ORDER — SENNOSIDES-DOCUSATE SODIUM 8.6-50 MG PO TABS
2.0000 | ORAL_TABLET | ORAL | Status: DC
  Administered 2021-12-03 – 2021-12-04 (×2): 2 via ORAL
  Filled 2021-12-03 (×2): qty 2

## 2021-12-03 MED ORDER — WITCH HAZEL-GLYCERIN EX PADS: 1.0000 "application " | MEDICATED_PAD | CUTANEOUS | Status: DC | PRN

## 2021-12-03 MED ORDER — DIPHENHYDRAMINE HCL 25 MG PO CAPS: 25.0000 mg | ORAL_CAPSULE | Freq: Four times a day (QID) | ORAL | Status: DC | PRN

## 2021-12-03 MED ORDER — TETANUS-DIPHTH-ACELL PERTUSSIS 5-2.5-18.5 LF-MCG/0.5 IM SUSY: 0.5000 mL | PREFILLED_SYRINGE | Freq: Once | INTRAMUSCULAR | Status: DC

## 2021-12-03 MED ORDER — IBUPROFEN 600 MG PO TABS
600.0000 mg | ORAL_TABLET | Freq: Four times a day (QID) | ORAL | Status: DC
  Administered 2021-12-03 – 2021-12-04 (×7): 600 mg via ORAL
  Filled 2021-12-03 (×7): qty 1

## 2021-12-03 MED ORDER — BENZOCAINE-MENTHOL 20-0.5 % EX AERO
1.0000 | INHALATION_SPRAY | CUTANEOUS | Status: DC | PRN
Start: 2021-12-03 — End: 2021-12-05
  Administered 2021-12-03: 1 via TOPICAL
  Filled 2021-12-03: qty 56

## 2021-12-03 MED ORDER — ACETAMINOPHEN 325 MG PO TABS: 650.0000 mg | ORAL_TABLET | ORAL | Status: DC | PRN

## 2021-12-03 MED ORDER — COCONUT OIL OIL: 1.0000 "application " | TOPICAL_OIL | Status: DC | PRN

## 2021-12-03 MED ORDER — ZOLPIDEM TARTRATE 5 MG PO TABS: 5.0000 mg | ORAL_TABLET | Freq: Every evening | ORAL | Status: DC | PRN

## 2021-12-03 MED ORDER — SIMETHICONE 80 MG PO CHEW: 80.0000 mg | CHEWABLE_TABLET | ORAL | Status: DC | PRN

## 2021-12-03 MED ORDER — ONDANSETRON HCL 4 MG PO TABS: 4.0000 mg | ORAL_TABLET | ORAL | Status: DC | PRN

## 2021-12-03 MED ORDER — PRENATAL MULTIVITAMIN CH
1.0000 | ORAL_TABLET | Freq: Every day | ORAL | Status: DC
  Administered 2021-12-03 – 2021-12-04 (×2): 1 via ORAL
  Filled 2021-12-03 (×2): qty 1

## 2021-12-03 MED ORDER — DIBUCAINE (PERIANAL) 1 % EX OINT: 1.0000 "application " | TOPICAL_OINTMENT | CUTANEOUS | Status: DC | PRN

## 2021-12-03 MED ORDER — ONDANSETRON HCL 4 MG/2ML IJ SOLN: 4.0000 mg | INTRAMUSCULAR | Status: DC | PRN

## 2021-12-03 NOTE — Lactation Note (Signed)
This note was copied from a baby's chart. Lactation Consultation Note  Patient Name: Brandi Small [id number] Date: 12/03/2021   Age:41 hours Per RN ( Brandi Small) mom wants to work on latching infant without assistance, she will call if [facility] if she need further latch assistance, she declined for now. Maternal Data    Feeding    LATCH Score                    Lactation Tools Discussed/Used    Interventions    Discharge    Consult Status      Brandi Small 12/03/2021, 11:38 PM

## 2021-12-03 NOTE — Lactation Note (Signed)
This note was copied from a baby's chart. Lactation Consultation Note  Patient Name: Brandi Small [id number] Date: 12/03/2021 Reason for consult: Follow-up assessment;1st time breastfeeding;Primapara;Term Age:41 hours  LC in to visit with P1 Mom of term baby.  Baby has been sleepy, but did latch and breastfeed on and off for 12 mins after delivery.   Baby has had 2 spoon feedings of colostrum.  Mom has an easy flow of colostrum.    LC placed baby STS in football hold.  Mom hand expressed drops of colostrum.  Baby licking and opening his mouth but not wide enough to sustain a deep consistent latch to the breast.  LC initiated a 20 mm nipple shield, but baby too sleepy to latch.  Mom's breasts are compressible with erect nipples.  Mom to continue trying to latch.  Encouraged Mom to keep baby STS on her chest to encourage frequent feedings.  Mom requested baby to be swaddled in his crib so she can eat lunch.   Mom to hand express colostrum into medication cup (LC washed) and Mom will spoon feed colostrum if baby continues to not latch. Feeding Mother's Current Feeding Choice: Breast Milk  LATCH Score Latch: Repeated attempts needed to sustain latch, nipple held in mouth throughout feeding, stimulation needed to elicit sucking reflex.  Audible Swallowing: None  Type of Nipple: Everted at rest and after stimulation  Comfort (Breast/Nipple): Soft / non-tender  Hold (Positioning): Full assist, staff holds infant at breast  LATCH Score: 5  Interventions Interventions: Breast feeding basics reviewed;Assisted with latch;Skin to skin;Breast massage;Hand express;Support pillows;Position options;Expressed milk;Hand pump  Discharge Pump: Personal;DEBP Doy Mince Motif)  Consult Status Consult Status: Follow-up Date: 12/04/21 Follow-up type: In-patient    [place], Ewart 12/03/2021, 3:04 PM

## 2021-12-03 NOTE — Lactation Note (Signed)
This note was copied from a baby's chart. Lactation Consultation Note  Patient Name: Brandi Small [id number] Date: 12/03/2021 Reason for consult: Initial assessment Age:41 hours, P1, term female infant. Mom requested latch assistance tonight. Mom latched infant on her right breast using football hold position, infant was on and off beast ( not sustaining latch) , BF for 12 minutes. Mom was taught hand expression, she self expressed 3 mls of colostrum that was spoon feed to infant. Mom will continue to work towards latching infant at breast and will ask RN/LC for further latch assistance if needed.  Mom knows to breastfeed infant according to hunger cues, skin to skin. Mom knows if infant doesn't latch, she can hand express and give infant back her EBM by spoon.  Mom made aware of O/P services, breastfeeding support groups, community resources, and our phone # for post-discharge questions.   Maternal Data Has patient been taught Hand Expression?: Yes Does the patient have breastfeeding experience prior to this delivery?: No  Feeding Mother's Current Feeding Choice: Breast Milk  LATCH Score Latch: Repeated attempts needed to sustain latch, nipple held in mouth throughout feeding, stimulation needed to elicit sucking reflex.  Audible Swallowing: A few with stimulation  Type of Nipple: Everted at rest and after stimulation  Comfort (Breast/Nipple): Soft / non-tender  Hold (Positioning): Assistance needed to correctly position infant at breast and maintain latch.  LATCH Score: 7   Lactation Tools Discussed/Used    Interventions Interventions: Breast feeding basics reviewed;Assisted with latch;Skin to skin;Hand express;Breast compression;Adjust position;Support pillows;Position options;Expressed milk;Education;LC Services brochure  Discharge    Consult Status Consult Status: Follow-up Date: 12/03/21 Follow-up type: In-patient    Vicente Serene 12/03/2021, 3:35 AM

## 2021-12-03 NOTE — Anesthesia Postprocedure Evaluation (Signed)
Anesthesia Post Note  Patient: Brandi Small  Procedure(s) Performed: AN AD HOC LABOR EPIDURAL     Patient location during evaluation: Mother Baby Anesthesia Type: Epidural Level of consciousness: awake and alert and oriented Pain management: satisfactory to patient Vital Signs Assessment: post-procedure vital signs reviewed and stable Respiratory status: respiratory function stable Cardiovascular status: stable Postop Assessment: no headache, no backache, epidural receding, patient able to bend at knees, no signs of nausea or vomiting and adequate PO intake Anesthetic complications: no   No notable events documented.  Last Vitals:  Vitals:   12/03/21 0240 12/03/21 0340  BP: (!) 144/78 117/84  Pulse: 75 70  Resp: 18 18  Temp: 37 C 37.1 C  SpO2: 100%     Last Pain:  Vitals:   12/03/21 0610  TempSrc:   PainSc: 0-No pain   Pain Goal:                   Brandi Small

## 2021-12-03 NOTE — Lactation Note (Signed)
This note was copied from a baby's chart. Lactation Consultation Note  Patient Name: Brandi Small Date: 12/03/2021 Reason for consult: Follow-up assessment;Primapara;1st time breastfeeding;Term Age:41 hours  LC in to visit with P1 Mom of term baby.  Baby sleeping STS on Mom's chest.  Baby still hasn't latched and fed at the breast.  LC offered to assist.  LC tried laid back position, cross cradle hold, and football holds on both breast.  LC able to hand express drop of colostrum onto nipple.  Baby opening his mouth, but unable to open widely and sustain a deep latch.  Baby sucking on his tongue, suck training done.  Baby has a highly arched palate.  LC initiated a 20 mm nipple shield and baby latched after a few attempts.  Baby latched onto base of nipple shield.  Taught Mom to support firmly her breast.  LC tried pulling down on baby's chin to open his mouth wider.  Lower lips flanged. Baby sucking with deep and deeper jaw extensions. Mom feeling a tug.  After 20 mins, baby came off.  Unable to see any colostrum in the shield, but Mom's nipple was pulled further into shield.  Unable to determine if milk was transferred.  Baby assisted to latch right back onto breast WITHOUT the nipple shield.  Mom feeling a stronger tug and baby relaxed and sucking with deep jaw extensions.  Mom feeling a strong feeling of relaxation (talked about hormonal affects of baby breastfeeding).  DEBP set up at bedside.  Mom instructed to use if baby is latched using the nipple shield.  Mom taught of the importance of disassembling pump parts, washing, rinsing and air drying in separate bin provided.  Plan recommended- 1-STS as much as possible 2- Offer breast with feeding cues.  If baby asleep for 3 hrs, unwrap and place STS on chest to encouraged baby to eat. 3- Mom to double pump on initiation setting if using a nipple shield during the latch. 4- Mom can hand express as well into a spoon for extra  feeding.  Mom knows to ask for help prn.   LATCH Score Latch: Grasps breast easily, tongue down, lips flanged, rhythmical sucking.  Audible Swallowing: Spontaneous and intermittent  Type of Nipple: Everted at rest and after stimulation  Comfort (Breast/Nipple): Soft / non-tender  Hold (Positioning): Assistance needed to correctly position infant at breast and maintain latch.  LATCH Score: 9   Lactation Tools Discussed/Used Tools: Nipple Shields;Pump;Flanges Nipple shield size: 20 Flange Size: 24 Breast pump type: Double-Electric Breast Pump Pump Education: Setup, frequency, and cleaning;Milk Storage Reason for Pumping: Support milk supply/use of nipple shield to help with a deep latch  Interventions Interventions: Assisted with latch;Skin to skin;Breast massage;Hand express;Breast compression;Adjust position;Support pillows;Position options;DEBP Consult Status Consult Status: Follow-up Date: 12/04/21 Follow-up type: In-patient    Brandi Small, Brandi Small 12/03/2021, 6:56 PM

## 2021-12-03 NOTE — Progress Notes (Signed)
PPD #0 No problems Afeb, VSS Fundus firm, NT at U-1 Continue routine postpartum care, will plan for circumcision

## 2021-12-04 NOTE — Lactation Note (Signed)
This note was copied from a baby's chart. Lactation Consultation Note  Patient Name: Brandi Small [id number] Date: 12/04/2021 Reason for consult: Mother's request (Mom requested LC services tonight to assist with latch.) Age:41 hours Per mom, infant has not been sustaining latch, mom latched infant on her right breast using the football hold position, infant was on and off the breast for 10 minutes. Mom applied 20 mm NS that was pre-filled with 0.5 mls of EBM and infant breastfeed for another 20 minutes the total feeding was 30 minutes. Infant was given 9.5 mls of mom's EBM that was finger feed with curve tip syringe that mom had hand expressed. Mom will continue to work on latching infant at the breast and will ask for further latch assistance if needed by RN/LC. Mom plans to use DEBP again and pump for 15 minutes on initial setting.  Mom will continue to use 20 mm NS tonight and will try latch infant without NS tomorrow morning.  Maternal Data    Feeding Mother's Current Feeding Choice: Breast Milk  LATCH Score Latch: Grasps breast easily, tongue down, lips flanged, rhythmical sucking. (Infant sustained latch with 20 mm NS)  Audible Swallowing: Spontaneous and intermittent  Type of Nipple: Inverted  Comfort (Breast/Nipple): Soft / non-tender  Hold (Positioning): Assistance needed to correctly position infant at breast and maintain latch.  LATCH Score: 7   Lactation Tools Discussed/Used Nipple shield size: 20  Interventions Interventions: Adjust position;Support pillows;Breast compression;Position options;Expressed milk;Education;Hand express;Skin to skin;Assisted with latch  Discharge    Consult Status Consult Status: Follow-up Date: 12/04/21 Follow-up type: In-patient    Danelle Earthly 12/04/2021, 1:57 AM

## 2021-12-04 NOTE — Lactation Note (Signed)
This note was copied from a baby's chart. Lactation Consultation Note  Patient Name: Brandi Small Jacobi [id number] Date: 12/04/2021 Reason for consult: Follow-up assessment;Mother's request;Term;Breastfeeding assistance Age:41 hours  LC assisted with latching infant in football with signs of milk transfer. Infant still feeding at the end of the visit.   Mom latching infant at the breast and breastfeeding exclusively without the NS.  Mom aware to feed by cues 8-12x 24hr period.  Infant adequate urine and stool output with stool transitioning to greenish color.   Mom doing well, placed prn. Mom to call for latch assistance if needed.   Maternal Data Has patient been taught Hand Expression?: Yes  Feeding Mother's Current Feeding Choice: Breast Milk  LATCH Score Latch: Repeated attempts needed to sustain latch, nipple held in mouth throughout feeding, stimulation needed to elicit sucking reflex.  Audible Swallowing: Spontaneous and intermittent  Type of Nipple: Everted at rest and after stimulation  Comfort (Breast/Nipple): Soft / non-tender  Hold (Positioning): Assistance needed to correctly position infant at breast and maintain latch.  LATCH Score: 8   Lactation Tools Discussed/Used    Interventions Interventions: Breast feeding basics reviewed;Assisted with latch;Skin to skin;Breast massage;Hand express;Breast compression;Adjust position;Support pillows;Position options;Expressed milk;Education;LC Magazine features editor;Infant Driven Feeding Algorithm education  Discharge Pump: Personal  Consult Status Consult Status: Follow-up Date: 12/05/21 Follow-up type: In-patient    Joselynn Amoroso  Nicholson-Springer 12/04/2021, 1:39 PM
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
## 2021-12-04 NOTE — Discharge Summary (Signed)
Postpartum Discharge Summary  Date of Service updated      Patient Name: Brandi Small DOB: 02/16/1981 MRN: [id number]  Date of admission: 12/01/2021 Delivery date:12/03/2021  Delivering provider: Paula Compton  Date of discharge: 12/04/2021  Admitting diagnosis: Advanced maternal age, 1st pregnancy, third trimester [O09.513] NSVD (normal spontaneous vaginal delivery) [O80] Intrauterine pregnancy: [redacted]w[redacted]d    Secondary diagnosis:  Principal Problem:   Advanced maternal age, 1st pregnancy, third trimester Active Problems:   NSVD (normal spontaneous vaginal delivery)  Additional problems: AMA    Discharge diagnosis: Term Pregnancy Delivered                                              Post partum procedures: none Augmentation: AROM, Pitocin, and Cytotec Complications: None  Hospital course: Induction of Labor With Vaginal Delivery   41y.o. yo G1P1001 at 41w0das admitted to the hospital 12/01/2021 for induction of labor.  Indication for induction: AMA.  Patient had an uncomplicated labor course as follows: Membrane Rupture Time/Date: 11:30 AM ,12/02/2021   Delivery Method:Vaginal, Spontaneous  Episiotomy: None  Lacerations:  2nd degree;Perineal;Periurethral  Details of delivery can be found in separate delivery note.  Patient had a routine postpartum course. Patient is discharged home 12/04/21.  Newborn Data: Birth date:12/03/2021  Birth time:12:25 AM  Gender:Female  Living status:Living  Apgars:9 ,9  Weight:2840 g   Magnesium Sulfate received: No BMZ received: No Rhophylac:No MMR:No T-DaP:Given prenatally Flu: No Transfusion:No  Physical exam  Vitals:   12/03/21 1240 12/03/21 1635 12/03/21 2105 12/04/21 0541  BP: 114/85 110/76 109/78 132/71  Pulse: 75 78 63 69  Resp: _0 Temp: 98.5 F (36.9 C) 97.7 F (36.5 C) 98 F (36.7 C) 98.3 F (36.8 C)  TempSrc: Oral Oral Oral Oral  SpO2: 100% 100%    Weight:      Height:        Labs: Lab Results   Component Value Date   WBC 14.6 (H) 12/03/2021   HGB 10.3 (L) 12/03/2021   HCT 30.1 (L) 12/03/2021   MCV 85.0 12/03/2021   PLT 144 (L) 12/03/2021   CMP Latest Ref Rng & Units 11/27/2020  Glucose 65 - 99 mg/dL 86  BUN 6 - 20 mg/dL 6  Creatinine 0.57 - 1.00 mg/dL 0.80  Sodium 134 - 144 mmol/L 141  Potassium 3.5 - 5.2 mmol/L 4.2  Chloride 96 - 106 mmol/L 103  CO2 20 - 29 mmol/L 22  Calcium 8.7 - 10.2 mg/dL 9.6  Total Protein 6.0 - 8.5 g/dL 7.0  Total Bilirubin 0.0 - 1.2 mg/dL 0.2  Alkaline Phos 44 - 121 IU/L 56  AST 0 - 40 IU/L 19  ALT 0 - 32 IU/L 13   Edinburgh Score: Edinburgh Postnatal Depression Scale Screening Tool 12/03/2021  I have been able to laugh and see the funny side of things. 0  I have looked forward with enjoyment to things. 0  I have blamed myself unnecessarily when things went wrong. 0  I have been anxious or worried for no good reason. 0  I have felt scared or panicky for no good reason. 2  Things have been getting on top of me. 1  I have been so unhappy that I have had difficulty sleeping. 1  I have felt sad or miserable. 1  I have been so  unhappy that I have been crying. 0  The thought of harming myself has occurred to me. 0  Edinburgh Postnatal Depression Scale Total 5      After visit meds:  Allergies as of 12/04/2021   No Known Allergies      Medication List     STOP taking these medications    aspirin EC 81 MG tablet       TAKE these medications    CALCIUM-MAGNESUIUM-ZINC PO Take by mouth.   cholecalciferol 25 MCG (1000 UNIT) tablet Commonly known as: VITAMIN D3 Take 1,000 Units by mouth daily.   NON FORMULARY Supplement Quuercetin   prenatal multivitamin Tabs tablet Take 1 tablet by mouth daily at 12 noon.   vitamin C 1000 MG tablet Take 1,000 mg by mouth daily.         Discharge home in stable condition Infant Feeding:  ? Infant Disposition:home with mother Discharge instruction: per After Visit Summary and  Postpartum booklet. Activity: Advance as tolerated. Pelvic rest for 6 weeks.  Diet: routine diet Anticipated Birth Control: Unsure Postpartum Appointment:4 weeks Additional Postpartum F/U:  none Future Appointments:No future appointments. Follow up Visit:  Follow-up Information     Bobbye Charleston, MD Follow up in 4 week(s).   Specialty: Obstetrics and Gynecology Contact information: [address] [place] [address] [phone]                     12/04/2021 Daria Pastures, MD

## 2021-12-04 NOTE — Progress Notes (Signed)
Patient is eating, ambulating, voiding.  Pain control is good.  Vitals:   12/03/21 1240 12/03/21 1635 12/03/21 2105 12/04/21 0541  BP: 114/85 110/76 109/78 132/71  Pulse: 75 78 63 69  Resp: 18 17 18 18   Temp: 98.5 F (36.9 C) 97.7 F (36.5 C) 98 F (36.7 C) 98.3 F (36.8 C)  TempSrc: Oral Oral Oral Oral  SpO2: 100% 100%    Weight:      Height:        Fundus firm Perineum without swelling.  Lab Results  Component Value Date   WBC 14.6 (H) 12/03/2021   HGB 10.3 (L) 12/03/2021   HCT 30.1 (L) 12/03/2021   MCV 85.0 12/03/2021   PLT 144 (L) 12/03/2021    --/--/O POS (02/20 1325)/RI  A/P Post partum day 1.  Routine care.  Expect d/c routine.   Parents desires circumsision.  All risks, benefits and alternatives discussed with the mother.   12-17-2002

## 2021-12-13 ENCOUNTER — Telehealth (HOSPITAL_COMMUNITY): Payer: Self-pay

## 2021-12-13 NOTE — Telephone Encounter (Signed)
No answer. Left message to return nurse call. ? Heron Nay ?12/13/2021,1222 ?

## 2022-02-12 ENCOUNTER — Ambulatory Visit: Payer: BC Managed Care – PPO | Attending: Obstetrics | Admitting: Physical Therapy

## 2022-02-12 DIAGNOSIS — M6281 Muscle weakness (generalized): Secondary | ICD-10-CM | POA: Diagnosis not present

## 2022-02-12 DIAGNOSIS — R102 Pelvic and perineal pain: Secondary | ICD-10-CM | POA: Insufficient documentation

## 2022-02-12 DIAGNOSIS — R293 Abnormal posture: Secondary | ICD-10-CM | POA: Diagnosis not present

## 2022-02-12 DIAGNOSIS — R279 Unspecified lack of coordination: Secondary | ICD-10-CM | POA: Diagnosis not present

## 2022-02-12 DIAGNOSIS — M62838 Other muscle spasm: Secondary | ICD-10-CM | POA: Insufficient documentation

## 2022-02-12 NOTE — Therapy (Signed)
?OUTPATIENT PHYSICAL THERAPY FEMALE PELVIC EVALUATION ? ? ?Patient Name: Brandi Small ?MRN: [id number] ?DOB:17-Apr-1981, 41 y.o., female ?Today's Date: 02/12/2022 ? ? PT End of Session - 02/12/22 1635   ? ? Visit Number 1   ? Date for PT Re-Evaluation 05/15/22   ? Authorization Type BCBS   ? PT Start Time 1450   arriva time  ? PT Stop Time 1529   ? PT Time Calculation (min) 39 min   ? Activity Tolerance Patient limited by pain   ? Behavior During Therapy [facility] for tasks assessed/performed   ? ?  ?  ? ?  ? ? ?Past Medical History:  ?Diagnosis Date  ? Anxiety   ? Headache   ? HSV (herpes simplex virus) infection   ? ?Past Surgical History:  ?Procedure Laterality Date  ? EYE SURGERY  2013  ? cyst removal left eye  ? ?Patient Active Problem List  ? Diagnosis Date Noted  ? NSVD (normal spontaneous vaginal delivery) 12/03/2021  ? Advanced maternal age, 1st pregnancy, third trimester 12/01/2021  ? ? ?PCP: Just, Azalee Course, FNP ? ?REFERRING PROVIDER: Marlow Baars, MD ? ?REFERRING DIAG: R10.2 (ICD-10-CM) - Pelvic and perineal pain ? ?THERAPY DIAG:  ?Muscle weakness (generalized) - Plan: PT plan of care cert/re-cert ? ?Abnormal posture - Plan: PT plan of care cert/re-cert ? ?Unspecified lack of coordination - Plan: PT plan of care cert/re-cert ? ?Other muscle spasm - Plan: PT plan of care cert/re-cert ? ?ONSET DATE: 10wks ago ? ?SUBJECTIVE:                                                                                                                                                                                          ? ?SUBJECTIVE STATEMENT: ?Pt had vaginal birth 10wks ago and had a second degree during labor. Pt reports she has pain with sneezing, laughing, walking, sitting. Was able to have vaginal assessment yesterday with MD with one digit placed internally and very painful on the Lt side.  ? ?Fluid intake: Yes: a lot of water 2.5-3 L per day   ? ?Patient confirms identification and approves PT to assess pelvic floor  and treatment Yes ? ? ?PAIN:  ?Are you having pain? Yes ?NPRS scale: 2/10 sitting in clinic; but worst is 7/10 with having a BM and urinating and prolonged sitting ?Pain location: Internal and Vaginal ? ?Pain type: pressure ?Pain description: constant  ? ?Aggravating factors: prolonged sitting, while having BM and urinating ?Relieving factors: pain medication was helpful, no longer taking any however  ? ?PRECAUTIONS: Other: postpartum ? ?WEIGHT BEARING RESTRICTIONS No ? ?FALLS:  ?Has patient fallen in last  6 months? No ? ?LIVING ENVIRONMENT: ?Lives with: lives with their family ?Lives in: House/apartment ? ? 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 ?PLOF: Independent ? ?PATIENT GOALS to have less pain ? ?PERTINENT HISTORY:  ?HSV, anxiety, HA ?Sexual abuse: No ? ?BOWEL MOVEMENT ?Pain with bowel movement: Yes ?Type of bowel movement:Type (Bristol Stool Scale) 4, Frequency daily, and Strain No ?Fully empty rectum: Yes:   ?Leakage: No ?Pads: No ?Fiber supplement: No ? ?URINATION ?Pain with urination: Yes ?Fully empty bladder: Yes: with strong urgency feels like she can't hold it long ?Stream: Strong ?Urgency: Yes: consistently ?Frequency: every hour ?Leakage:  no  ?Pads: No ? ?INTERCOURSE ?Pain with intercourse:  not active since delivery  no pain during pregnancy.  ?Ability to have vaginal penetration:  No ? ?Marinoff Scale: 3/3 ? ?PREGNANCY ?Vaginal deliveries 1 ?Tearing Yes: 2 nd degree tear ?C-section deliveries 0 ?Currently pregnant No ? ?PROLAPSE ?After urinating first morning void ? ? ? ?OBJECTIVE:  ? ?DIAGNOSTIC FINDINGS:  ? ?COGNITION: ? Overall cognitive status: Within functional limits for tasks assessed   ?  ?SENSATION: ? Light touch: Appears intact ? Proprioception: Appears intact ? ?MUSCLE LENGTH: ?Bil hamstrings and adductors limited by 25% ? ? ? ?POSTURE:  ?Rounded shoulders, posterior pelvic tilt ? ?LUMBARAROM/PROM ? ?Side bending and flexion bil  decreased by 25% all others WFL ? ?LE ROM: ? ?Bil WFL ? ?LE MMT: ? ?Bil hip abduction 3/5, adduction and extension 3+/5, and flexion 4/5; knees and ankles ? ?PELVIC MMT: ?  ?MMT  ?02/12/2022  ?Vaginal Unable to test due to pain  ?Internal Anal Sphincter   ?External Anal Sphincter   ?Puborectalis   ?Diastasis Recti less than one finger separation at DRA above umbilicus, with good density.   ?(Blank rows = not tested) ? ?      PALPATION: ?  General: fascial restrictions throughout upper and lower abdominal quadrants, TTP at lower Rt quadrant ? ?              External Perineal Exam TTP at Lt side only no pain at Rt at medial glute, bulbocavernosus, ischiocavernosus, and superficial transverse. Poor scar mobility at perineal scar.  ?              ?              Internal Pelvic Floor TTP superficially at bil bulbocavernosus, not progress deeper due to pain.  ? ?TONE: ?Increased  ? ?PROLAPSE: ?Unable to assess  ? ?TODAY'S TREATMENT  ?02/12/2022 EVAL Examination completed, findings reviewed, pt educated on POC, HEP, and *. Pt motivated to participate in PT and agreeable to attempt recommendations.  ? ? ? ?PATIENT EDUCATION:  ?Education details: [id number] ?Person educated: Patient ?Education method: Explanation, Demonstration, Tactile cues, Verbal cues, and Handouts ?Education comprehension: verbalized understanding and returned demonstration ? ? ?HOME EXERCISE PROGRAM: ?[id number] ? ?ASSESSMENT: ? ?CLINICAL IMPRESSION: ?Patient is a 41 y.o. female  who was seen today for physical therapy evaluation and treatment for pain postpartum. Pt is 10 weeks postpartum with first baby and had second degree tear with labor. Pt reports she was having pain with all mobility but now mostly with prolonged forward sitting, BMs and urination.  Pt found to have weakness in bil hips, decreased flexibility in spine and bil hips, decreased core strength, less than one finger separation at DRA above umbilicus. Pt consented to internal vaginal  assessment and pt at pain externally  at Lt side of pelvis, and less than one knuckle length of gloved digit inserted and pt reported pain at Lt side, no additional depth progressed due to pain and pt did have increased tension at bil bulbocavernosus. Pt also had decreased scar mobility and had abdominal fascial restrictions noted. Pt given and educated on HEP. Pt would benefit from additional PT to further address deficits.   ? ? ?OBJECTIVE IMPAIRMENTS decreased coordination, decreased endurance, decreased strength, increased fascial restrictions, increased muscle spasms, impaired flexibility, impaired tone, improper body mechanics, postural dysfunction, and pain.  ? ?ACTIVITY LIMITATIONS cleaning, community activity, driving, yard work, and shopping.  ? ?PERSONAL FACTORS Time since onset of injury/illness/exacerbation and 1 comorbidity: one vaginal birth with 2nd deg tear  are also affecting patient's functional outcome.  ? ? ?REHAB POTENTIAL: Good ? ?CLINICAL DECISION MAKING: Stable/uncomplicated ? ?EVALUATION COMPLEXITY: Low ? ? ?GOALS: ?Goals reviewed with patient? Yes ? ?SHORT TERM GOALS: Target date: 03/12/2022 ? ?Pt to be I with HEP. ?Baseline: ?Goal status: INITIAL ? ?2.  Pt to report no more than 5/10 pain at introitus for improved tolerance to vaginal penetration for medical assessments.   ?Baseline:  ?Goal status: INITIAL ? ?3.  Pt to be I with recall of proper voiding and breathing mechanics to decrease pain during voiding.  ?Baseline:  ?Goal status: INITIAL ? ?4.  Pt to report ability to hold urine for 2 hours without leakage for improved functional tolerance for community outings.  ?Baseline:  ?Goal status: INITIAL ? ? ? ?LONG TERM GOALS: Target date: 05/15/2022 ? ?Pt to be I with advanced HEP. ?Baseline:  ?Goal status: INITIAL ? ?2.  Pt to report no more than 2/10 pain at introitus for improved tolerance to vaginal penetration for medical assessments.  ?Baseline:  ?Goal status: INITIAL ? ?3.  Pt to report  ability to hold urine for 3 hours without leakage for improved functional tolerance for community outings. ?Baseline:  ?Goal status: INITIAL ? ?4. Pt to demonstrate at least 5/5 bil hip strength for improved pe

## 2022-02-26 ENCOUNTER — Ambulatory Visit: Payer: BC Managed Care – PPO | Admitting: Physical Therapy

## 2022-02-26 DIAGNOSIS — M6281 Muscle weakness (generalized): Secondary | ICD-10-CM

## 2022-02-26 DIAGNOSIS — R102 Pelvic and perineal pain: Secondary | ICD-10-CM | POA: Diagnosis not present

## 2022-02-26 DIAGNOSIS — R293 Abnormal posture: Secondary | ICD-10-CM

## 2022-02-26 DIAGNOSIS — M62838 Other muscle spasm: Secondary | ICD-10-CM

## 2022-02-26 NOTE — Therapy (Signed)
OUTPATIENT PHYSICAL THERAPY TREATMENT NOTE   Patient Name: Brandi Small MRN: [id number] DOB:02-04-1981, 41 y.o., female Today's Date: 02/26/2022  PCP: Just, Azalee Course, FNP REFERRING PROVIDER:  Marlow Baars, MD  END OF SESSION:   PT End of Session - 02/26/22 1625     Visit Number 2    Date for PT Re-Evaluation 05/15/22    Authorization Type BCBS    PT Start Time 1623    PT Stop Time 1656    PT Time Calculation (min) 33 min    Activity Tolerance Patient limited by pain    Behavior During Therapy WFL for tasks assessed/performed             Past Medical History:  Diagnosis Date   Anxiety    Headache    HSV (herpes simplex virus) infection    Past Surgical History:  Procedure Laterality Date   EYE SURGERY  2013   cyst removal left eye   Patient Active Problem List   Diagnosis Date Noted   NSVD (normal spontaneous vaginal delivery) 12/03/2021   Advanced maternal age, 1st pregnancy, third trimester 12/01/2021    REFERRING DIAG: R10.2 (ICD-10-CM) - Pelvic and perineal pain  THERAPY DIAG:  Muscle weakness (generalized)  Other muscle spasm  Abnormal posture  Rationale for Evaluation and Treatment Rehabilitation  PERTINENT HISTORY: HSV, anxiety, HA Sexual abuse: No  PRECAUTIONS: Other: postpartum  SUBJECTIVE: Pt reports no longer having pain with BMs, sneezing or laughing, first urination of morning does still have pain but not as bad. Pt reports she doesn't have pain with short distances of walking but did have pain after walking 2 miles with stroller and felt pain at scar tissue site and did have some burning with urination after this as well.   PAIN:  Are you having pain? No   OBJECTIVE: (objective measures completed at initial evaluation unless otherwise dated)   DIAGNOSTIC FINDINGS:    COGNITION:            Overall cognitive status: Within functional limits for tasks assessed                          SENSATION:            Light touch: Appears  intact            Proprioception: Appears intact   MUSCLE LENGTH: Bil hamstrings and adductors limited by 25%       POSTURE:  Rounded shoulders, posterior pelvic tilt   LUMBARAROM/PROM   Side bending and flexion bil decreased by 25% all others WFL   LE ROM:   Bil WFL   LE MMT:   Bil hip abduction 3/5, adduction and extension 3+/5, and flexion 4/5; knees and ankles   PELVIC MMT:   MMT   02/12/2022  Vaginal Unable to test due to pain  Internal Anal Sphincter    External Anal Sphincter    Puborectalis    Diastasis Recti less than one finger separation at DRA above umbilicus, with good density.   (Blank rows = not tested)         PALPATION:   General: fascial restrictions throughout upper and lower abdominal quadrants, TTP at lower Rt quadrant                 External Perineal Exam TTP at Lt side only no pain at Rt at medial glute, bulbocavernosus, ischiocavernosus, and superficial transverse. Poor scar mobility at perineal scar.  Internal Pelvic Floor TTP superficially at bil bulbocavernosus, not progress deeper due to pain.    TONE: Increased    PROLAPSE: Unable to assess    TODAY'S TREATMENT   02/26/2022: Stretching with strap: hamstring, adductor, abductor bil 2x30s each Unilateral Happy baby 2x30s Child's pose/side bending child's pose 2x30s Manual perineal scar massage in Rt and posterior directions very gentle stretching, circles, in Rt and downward directions to pt's tolerance.     02/12/2022 EVAL Examination completed, findings reviewed, pt educated on POC, HEP. Pt motivated to participate in PT and agreeable to attempt recommendations.        PATIENT EDUCATION:  Education details: [id number] Person educated: Patient Education method: Explanation, Demonstration, Tactile cues, Verbal cues, and Handouts Education comprehension: verbalized understanding and returned demonstration     HOME EXERCISE PROGRAM: [id number]    ASSESSMENT:   CLINICAL IMPRESSION: Patient reports she has had improvement with pain overall since eval but still remain with urination and increasing activity. Pt has not attempted vaginal penetration yet so unsure if this painful. Pt session focused on hip and spinal mobility/stretching and manual work at perineal body. Pt tolerated well and demonstrated improved mobility but still have restrictions felt. Pt would benefit from additional PT to further address deficits.       OBJECTIVE IMPAIRMENTS decreased coordination, decreased endurance, decreased strength, increased fascial restrictions, increased muscle spasms, impaired flexibility, impaired tone, improper body mechanics, postural dysfunction, and pain.    ACTIVITY LIMITATIONS cleaning, community activity, driving, yard work, and shopping.    PERSONAL FACTORS Time since onset of injury/illness/exacerbation and 1 comorbidity: one vaginal birth with 2nd deg tear  are also affecting patient's functional outcome.      REHAB POTENTIAL: Good   CLINICAL DECISION MAKING: Stable/uncomplicated   EVALUATION COMPLEXITY: Low     GOALS: Goals reviewed with patient? Yes   SHORT TERM GOALS: Target date: 03/12/2022   Pt to be I with HEP. Baseline: Goal status: INITIAL   2.  Pt to report no more than 5/10 pain at introitus for improved tolerance to vaginal penetration for medical assessments.   Baseline:  Goal status: INITIAL   3.  Pt to be I with recall of proper voiding and breathing mechanics to decrease pain during voiding.  Baseline:  Goal status: INITIAL   4.  Pt to report ability to hold urine for 2 hours without leakage for improved functional tolerance for community outings.  Baseline:  Goal status: INITIAL       LONG TERM GOALS: Target date: 05/15/2022   Pt to be I with advanced HEP. Baseline:  Goal status: INITIAL   2.  Pt to report no more than 2/10 pain at introitus for improved tolerance to vaginal penetration for  medical assessments.  Baseline:  Goal status: INITIAL   3.  Pt to report ability to hold urine for 3 hours without leakage for improved functional tolerance for community outings. Baseline:  Goal status: INITIAL   4. Pt to demonstrate at least 5/5 bil hip strength for improved pelvic stability and functional squats without pain.   Baseline:  Goal status: INITIAL   5.  Pt to demonstrate improved coordination of pelvic floor and breathing mechanics for functional tasks (squatting 20#) without pain for caring for child.  Baseline:  Goal status: INITIAL       PLAN: PT FREQUENCY: 1x/week   PT DURATION:  8 sessions   PLANNED INTERVENTIONS: Therapeutic exercises, Therapeutic activity, Neuromuscular re-education, Patient/Family education, Joint mobilization, Dry Needling, Spinal  mobilization, Cryotherapy, Moist heat, Manual lymph drainage, scar mobilization, Taping, Biofeedback, and Manual therapy   PLAN FOR NEXT SESSION: pelvic relaxation, stretching at hips, core and back, core strengthening   Otelia Sergeant, PT, DPT 05/18/235:06 PM

## 2022-03-03 ENCOUNTER — Ambulatory Visit: Payer: BC Managed Care – PPO | Admitting: Physical Therapy

## 2022-03-03 DIAGNOSIS — M6281 Muscle weakness (generalized): Secondary | ICD-10-CM

## 2022-03-03 DIAGNOSIS — R293 Abnormal posture: Secondary | ICD-10-CM

## 2022-03-03 DIAGNOSIS — R102 Pelvic and perineal pain: Secondary | ICD-10-CM | POA: Diagnosis not present

## 2022-03-03 DIAGNOSIS — R279 Unspecified lack of coordination: Secondary | ICD-10-CM

## 2022-03-03 NOTE — Therapy (Signed)
OUTPATIENT PHYSICAL THERAPY TREATMENT NOTE   Patient Name: Brandi ScoreCaroline Filosa MRN: [id number] DOB:24-Dec-1980, 41 y.o., female Today's Date: 03/03/2022  PCP: Just, Azalee CourseKelsea J, FNP REFERRING PROVIDER:  Marlow Baarslark, Dyanna, MD  END OF SESSION:   PT End of Session - 03/03/22 1635     Visit Number 3    Date for PT Re-Evaluation 05/15/22    Authorization Type [employer]    PT Start Time 1625   pt arrival time   PT Stop Time 1650   pt requested to leave early to return home to baby   PT Time Calculation (min) 25 min    Activity Tolerance Patient limited by pain    [facility] tasks assessed/performed              Past Medical History:  Diagnosis Date   Anxiety    Headache    HSV (herpes simplex virus) infection    Past Surgical History:  Procedure Laterality Date   EYE SURGERY  2013   cyst removal left eye   Patient Active Problem List   Diagnosis Date Noted   NSVD (normal spontaneous vaginal delivery) 12/03/2021   Advanced maternal age, 1st pregnancy, third trimester 12/01/2021    REFERRING DIAG: R10.2 (ICD-10-CM) - Pelvic and perineal pain  THERAPY DIAG:  Muscle weakness (generalized)  Abnormal posture  Unspecified lack of coordination  Rationale for Evaluation and Treatment Rehabilitation  PERTINENT HISTORY: HSV, anxiety, HA Sexual abuse: No  PRECAUTIONS: Other: postpartum  SUBJECTIVE: Pt reports she is feeling much better and was able to have intercourse since last visit without pain but unable to orgasm and having mild soreness. Pt reports she has been doing HEP 2x per day and manual self stretching daily.   PAIN:  Are you having pain? No   OBJECTIVE: (objective measures completed at initial evaluation unless otherwise dated)   DIAGNOSTIC FINDINGS:    COGNITION:            Overall cognitive status: Within functional limits for tasks assessed                          SENSATION:            Light touch: Appears intact             Proprioception: Appears intact   MUSCLE LENGTH: Bil hamstrings and adductors limited by 25%       POSTURE:  Rounded shoulders, posterior pelvic tilt   LUMBARAROM/PROM   Side bending and flexion bil decreased by 25% all others WFL   LE ROM:   Bil WFL   LE MMT:   Bil hip abduction 3/5, adduction and extension 3+/5, and flexion 4/5; knees and ankles   PELVIC MMT:   MMT   02/12/2022  Vaginal Unable to test due to pain  Internal Anal Sphincter    External Anal Sphincter    Puborectalis    Diastasis Recti less than one finger separation at DRA above umbilicus, with good density.   (Blank rows = not tested)         PALPATION:   General: fascial restrictions throughout upper and lower abdominal quadrants, TTP at lower Rt quadrant                 External Perineal Exam TTP at Lt side only no pain at Rt at medial glute, bulbocavernosus, ischiocavernosus, and superficial transverse. Poor scar mobility at perineal scar.  Internal Pelvic Floor TTP superficially at bil bulbocavernosus, not progress deeper due to pain.    TONE: Increased    PROLAPSE: Unable to assess    TODAY'S TREATMENT   03/03/2022:  Updated HEP and reviewed with pt and also discussed attempting gentle pelvic floor contractions as long as there is no pain. Pt able to review and recall well Pt denied internal treatment reporting she was not prepared for this today but agreeable for next session.  Pt requested to end session early as she wanted to return home to baby.   02/26/2022: Stretching with strap: hamstring, adductor, abductor bil 2x30s each Unilateral Happy baby 2x30s Child's pose/side bending child's pose 2x30s Manual perineal scar massage in Rt and posterior directions very gentle stretching, circles, in Rt and downward directions to pt's tolerance.     02/12/2022 EVAL Examination completed, findings reviewed, pt educated on POC, HEP. Pt motivated to participate in PT and  agreeable to attempt recommendations.        PATIENT EDUCATION:  Education details: [id number] Person educated: Patient Education method: Explanation, Demonstration, Tactile cues, Verbal cues, and Handouts Education comprehension: verbalized understanding and returned demonstration     HOME EXERCISE PROGRAM: [id number]   ASSESSMENT:   CLINICAL IMPRESSION: Patient reports she is very pleased about her progress as her pain has gotten much better and she is more mobile and has returned to intercourse. Pt session focused on educating on updated HEP and continuing with gentle stretching. Pt would benefit from additional PT to further address deficits.       OBJECTIVE IMPAIRMENTS decreased coordination, decreased endurance, decreased strength, increased fascial restrictions, increased muscle spasms, impaired flexibility, impaired tone, improper body mechanics, postural dysfunction, and pain.    ACTIVITY LIMITATIONS cleaning, community activity, driving, yard work, and shopping.    PERSONAL FACTORS Time since onset of injury/illness/exacerbation and 1 comorbidity: one vaginal birth with 2nd deg tear  are also affecting patient's functional outcome.      REHAB POTENTIAL: Good   CLINICAL DECISION MAKING: Stable/uncomplicated   EVALUATION COMPLEXITY: Low     GOALS: Goals reviewed with patient? Yes   SHORT TERM GOALS: Target date: 03/12/2022   Pt to be I with HEP. Baseline: Goal status: INITIAL   2.  Pt to report no more than 5/10 pain at introitus for improved tolerance to vaginal penetration for medical assessments.   Baseline:  Goal status: INITIAL   3.  Pt to be I with recall of proper voiding and breathing mechanics to decrease pain during voiding.  Baseline:  Goal status: INITIAL   4.  Pt to report ability to hold urine for 2 hours without leakage for improved functional tolerance for community outings.  Baseline:  Goal status: INITIAL       LONG TERM GOALS: Target date:  05/15/2022   Pt to be I with advanced HEP. Baseline:  Goal status: INITIAL   2.  Pt to report no more than 2/10 pain at introitus for improved tolerance to vaginal penetration for medical assessments.  Baseline:  Goal status: INITIAL   3.  Pt to report ability to hold urine for 3 hours without leakage for improved functional tolerance for community outings. Baseline:  Goal status: INITIAL   4. Pt to demonstrate at least 5/5 bil hip strength for improved pelvic stability and functional squats without pain.   Baseline:  Goal status: INITIAL   5.  Pt to demonstrate improved coordination of pelvic floor and breathing mechanics for functional tasks (squatting 20#)  without pain for caring for child.  Baseline:  Goal status: INITIAL       PLAN: PT FREQUENCY: 1x/week   PT DURATION:  8 sessions   PLANNED INTERVENTIONS: Therapeutic exercises, Therapeutic activity, Neuromuscular re-education, Patient/Family education, Joint mobilization, Dry Needling, Spinal mobilization, Cryotherapy, Moist heat, Manual lymph drainage, scar mobilization, Taping, Biofeedback, and Manual therapy   PLAN FOR NEXT SESSION: pelvic relaxation, stretching at hips, core and back, core strengthening  Otelia Sergeant, PT, DPT 05/23/235:03 PM

## 2022-04-08 ENCOUNTER — Ambulatory Visit: Payer: BC Managed Care – PPO | Attending: Obstetrics | Admitting: Physical Therapy

## 2022-04-08 DIAGNOSIS — M6281 Muscle weakness (generalized): Secondary | ICD-10-CM | POA: Diagnosis present

## 2022-04-08 DIAGNOSIS — R293 Abnormal posture: Secondary | ICD-10-CM | POA: Diagnosis present

## 2022-04-08 NOTE — Therapy (Signed)
OUTPATIENT PHYSICAL THERAPY TREATMENT NOTE   Patient Name: Brandi Small MRN: [id number] DOB:[birth year], 41 y.o., female Today's Date: 04/08/2022  PCP: Just, Laurita Quint, FNP REFERRING PROVIDER:  Jerelyn Charles, MD  END OF SESSION:   PT End of Session - 04/08/22 1614     Visit Number 4    Date for PT Re-Evaluation 05/15/22    Authorization Type [employer]    PT Start Time 1545   pt arrival time   PT Stop Time 1615    PT Time Calculation (min) 30 min    Activity Tolerance Patient limited by pain    Behavior During Therapy [facility] for tasks assessed/performed               Past Medical History:  Diagnosis Date   Anxiety    Headache    HSV (herpes simplex virus) infection    Past Surgical History:  Procedure Laterality Date   EYE SURGERY  2013   cyst removal left eye   Patient Active Problem List   Diagnosis Date Noted   NSVD (normal spontaneous vaginal delivery) 12/03/2021   Advanced maternal age, 1st pregnancy, third trimester 12/01/2021    REFERRING DIAG: R10.2 (ICD-10-CM) - Pelvic and perineal pain  THERAPY DIAG:  Muscle weakness (generalized)  Abnormal posture  Rationale for Evaluation and Treatment Rehabilitation  PERTINENT HISTORY: HSV, anxiety, HA Sexual abuse: No  PRECAUTIONS: Other: postpartum  SUBJECTIVE: Pt reports she is doing much better, is no longer having issues. Pt now able to workout, has been seeing a trainer and does yoga once a week. Pt does report she needs to urinate every couple of hours however is drinking a lot of water all the time with breastfeeding.  Pt not having any leakage, pain at all, bleeding.   PAIN:  Are you having pain? No   OBJECTIVE: (objective measures completed at initial evaluation unless otherwise dated)   DIAGNOSTIC FINDINGS:    COGNITION:            Overall cognitive status: Within functional limits for tasks assessed                          SENSATION:            Light touch: Appears intact             Proprioception: Appears intact   MUSCLE LENGTH: Bil hamstrings and adductors limited by 25%       POSTURE:  Rounded shoulders, posterior pelvic tilt   LUMBARAROM/PROM   Side bending and flexion bil decreased by 25% all others WFL   LE ROM:   Bil WFL   LE MMT:   Bil hip abduction 3/5, adduction and extension 3+/5, and flexion 4/5; knees and ankles   PELVIC MMT:   MMT   02/12/2022  Vaginal Unable to test due to pain  Internal Anal Sphincter    External Anal Sphincter    Puborectalis    Diastasis Recti less than one finger separation at DRA above umbilicus, with good density.   (Blank rows = not tested)         PALPATION:   General: fascial restrictions throughout upper and lower abdominal quadrants, TTP at lower Rt quadrant                 External Perineal Exam TTP at Lt side only no pain at Rt at medial glute, bulbocavernosus, ischiocavernosus, and superficial transverse. Poor scar mobility at perineal scar.  Internal Pelvic Floor TTP superficially at bil bulbocavernosus, not progress deeper due to pain.    TONE: Increased    PROLAPSE: Unable to assess    TODAY'S TREATMENT   04/08/22:    03/03/2022:  Updated HEP and reviewed with pt and also discussed attempting gentle pelvic floor contractions as long as there is no pain. Pt able to review and recall well Pt denied internal treatment reporting she was not prepared for this today but agreeable for next session.  Pt requested to end session early as she wanted to return home to baby.         PATIENT EDUCATION:  Education details: [id number] Person educated: Patient Education method: Explanation, Demonstration, Tactile cues, Verbal cues, and Handouts Education comprehension: verbalized understanding and returned demonstration     HOME EXERCISE PROGRAM: [id number]   ASSESSMENT:   CLINICAL IMPRESSION: Patient reports she is very pleased about her progress as her pain has gotten  much better and she is more mobile and has returned to intercourse. Pt session focused on educating on updated HEP and continuing with gentle stretching. Pt would benefit from additional PT to further address deficits.       OBJECTIVE IMPAIRMENTS decreased coordination, decreased endurance, decreased strength, increased fascial restrictions, increased muscle spasms, impaired flexibility, impaired tone, improper body mechanics, postural dysfunction, and pain.    ACTIVITY LIMITATIONS cleaning, community activity, driving, yard work, and shopping.    PERSONAL FACTORS Time since onset of injury/illness/exacerbation and 1 comorbidity: one vaginal birth with 2nd deg tear  are also affecting patient's functional outcome.      REHAB POTENTIAL: Good   CLINICAL DECISION MAKING: Stable/uncomplicated   EVALUATION COMPLEXITY: Low     GOALS: Goals reviewed with patient? Yes   SHORT TERM GOALS: Target date: 03/12/2022   Pt to be I with HEP. Baseline: Goal status: MET   2.  Pt to report no more than 5/10 pain at introitus for improved tolerance to vaginal penetration for medical assessments.   Baseline:  Goal status: MET   3.  Pt to be I with recall of proper voiding and breathing mechanics to decrease pain during voiding.  Baseline:  Goal status: MET   4.  Pt to report ability to hold urine for 2 hours without leakage for improved functional tolerance for community outings.  Baseline:  Goal status: MET       LONG TERM GOALS: Target date: 05/15/2022   Pt to be I with advanced HEP. Baseline:  Goal status: MET   2.  Pt to report no more than 2/10 pain at introitus for improved tolerance to vaginal penetration for medical assessments.  Baseline:  Goal status: MET   3.  Pt to report ability to hold urine for 3 hours without leakage for improved functional tolerance for community outings. Baseline:  Goal status: NOT MET   4. Pt to demonstrate at least 5/5 bil hip strength for improved  pelvic stability and functional squats without pain.   Baseline:  Goal status: MET   5.  Pt to demonstrate improved coordination of pelvic floor and breathing mechanics for functional tasks (squatting 20#) without pain for caring for child.  Baseline:  Goal status: MET       PLAN: PT FREQUENCY: 1x/week   PT DURATION:  8 sessions   PLANNED INTERVENTIONS: Therapeutic exercises, Therapeutic activity, Neuromuscular re-education, Patient/Family education, Joint mobilization, Dry Needling, Spinal mobilization, Cryotherapy, Moist heat, Manual lymph drainage, scar mobilization, Taping, Biofeedback, and Manual therapy   PLAN  FOR NEXT SESSION: pelvic relaxation, stretching at hips, core and back, core strengthening  PHYSICAL THERAPY DISCHARGE SUMMARY  Visits from Start of Care: 4  Current functional level related to goals / functional outcomes: All STG met and 4/5 LTG met.    Remaining deficits: None per pt that are bothersome, pt reports she doesn't wait 3 hours to urinate.   Education / Equipment: HEP   Patient agrees to discharge. Patient goals were partially met. Patient is being discharged due to being pleased with the current functional level.   Stacy Gardner, PT, DPT 06/28/235:02 PM

## 2022-04-08 NOTE — Patient Instructions (Signed)
Return to Running Criteria (from Groome et al, 2019)  Ability to perform 20-30 repetitions of each of the following without fatigue: Single calf raise (can start with fingertips on wall as needed for support) Single leg bridge Single leg sit to stand Standing hip abduction (can start with fingertips on wall as needed for support)   FOR INDIVIDUALS WITH PELVIC FLOOR SYMPTOMS: Ability to perform without leaking or prolapse symptoms: Walk 30 minutes Single leg balance 30 seconds Single leg squat 10 repetitions (2-3 sets) Jog in place 1 minute Forward bounding 10 repetitions Single leg hop 10 repetitions Single leg runners 10 repetitions

## 2022-04-15 ENCOUNTER — Encounter: Payer: BC Managed Care – PPO | Admitting: Physical Therapy

## 2022-04-22 ENCOUNTER — Encounter: Payer: BC Managed Care – PPO | Admitting: Physical Therapy

## 2022-04-29 ENCOUNTER — Encounter: Payer: BC Managed Care – PPO | Admitting: Physical Therapy

## 2022-05-06 ENCOUNTER — Encounter: Payer: BC Managed Care – PPO | Admitting: Physical Therapy

## 2022-05-13 ENCOUNTER — Encounter: Payer: BC Managed Care – PPO | Admitting: Physical Therapy

## 2022-05-20 ENCOUNTER — Encounter: Payer: BC Managed Care – PPO | Admitting: Physical Therapy

## 2022-05-27 ENCOUNTER — Encounter: Payer: BC Managed Care – PPO | Admitting: Physical Therapy

## 2022-06-03 ENCOUNTER — Encounter: Payer: BC Managed Care – PPO | Admitting: Physical Therapy

## 2022-10-02 IMAGING — CR DG WRIST COMPLETE 3+V*L*
4 series · 4 of 4 positions shown · non-contrast
Comparison: None.

CLINICAL DATA: Pain of both wrists

EXAM:
LEFT WRIST - COMPLETE 3+ VIEW

[x wrist pa left]
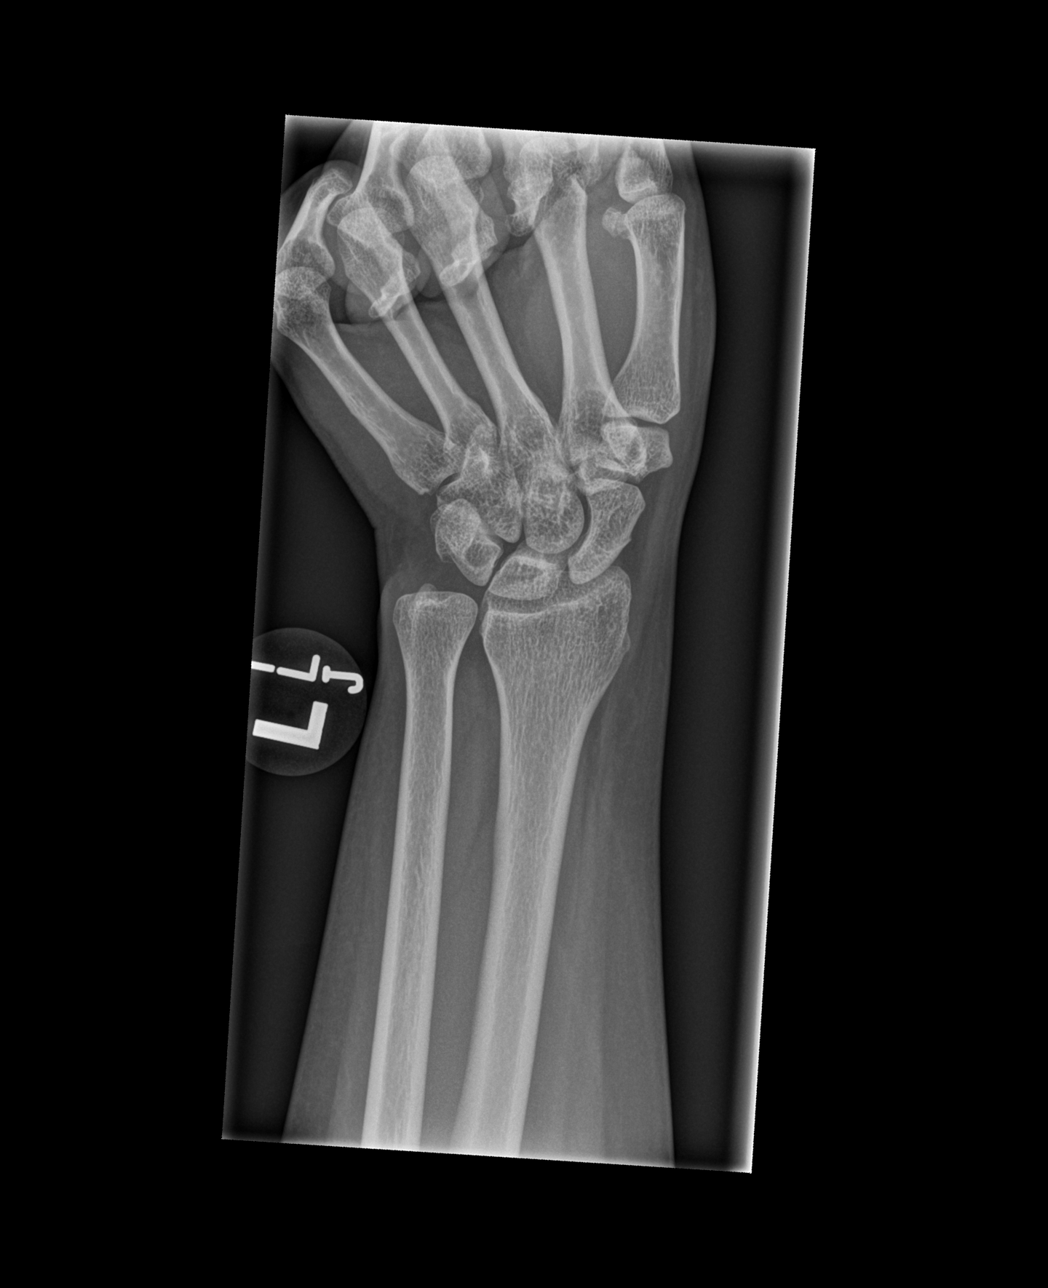

[x wrist obl left]
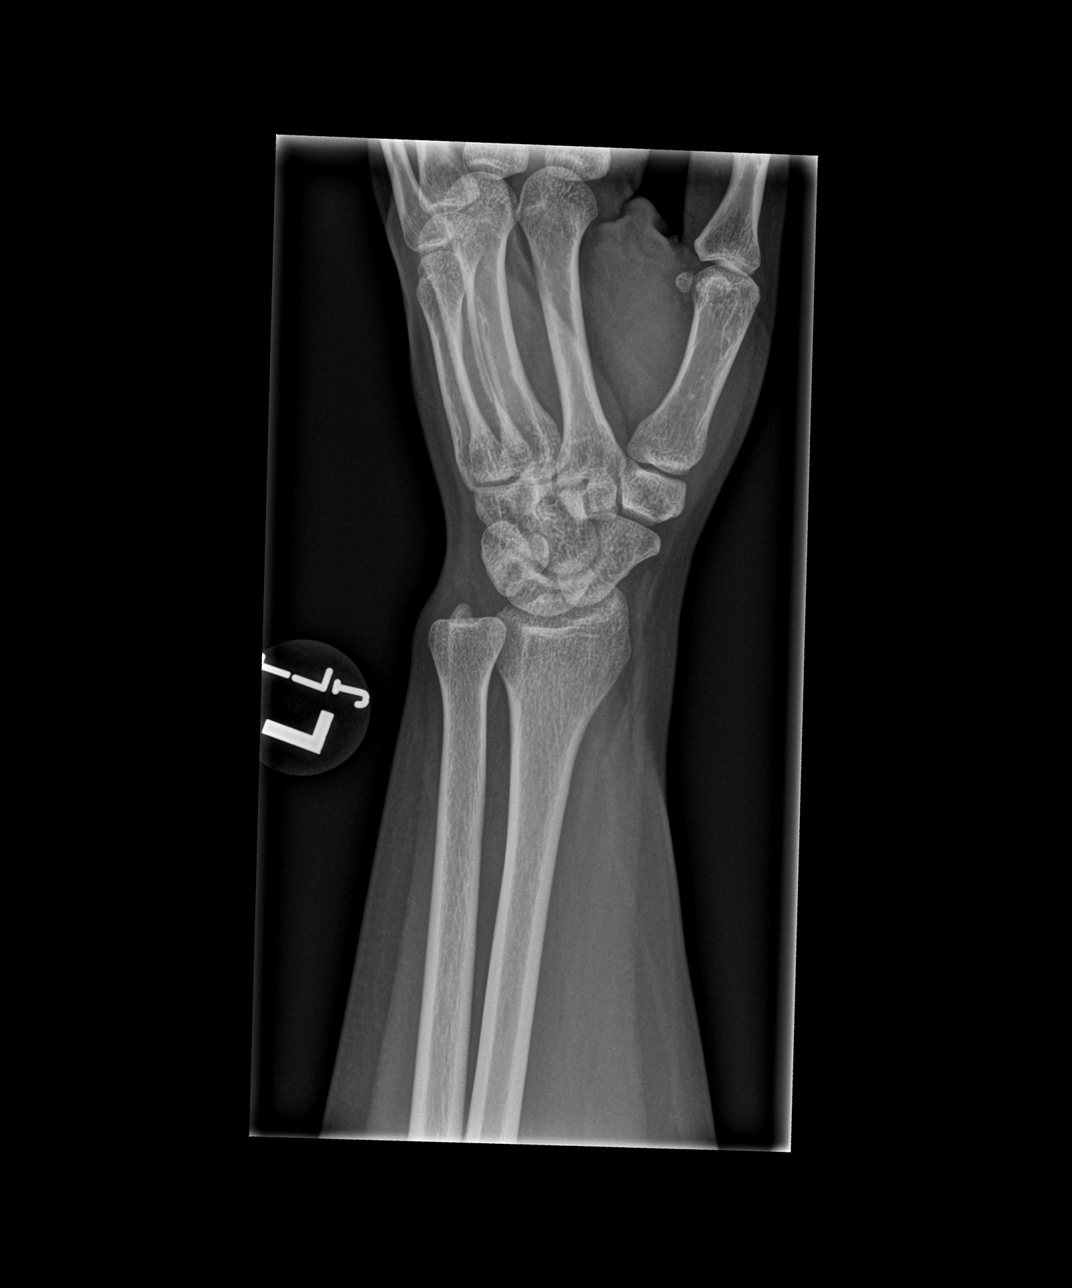

[x wrist lat left (1 of 2)]
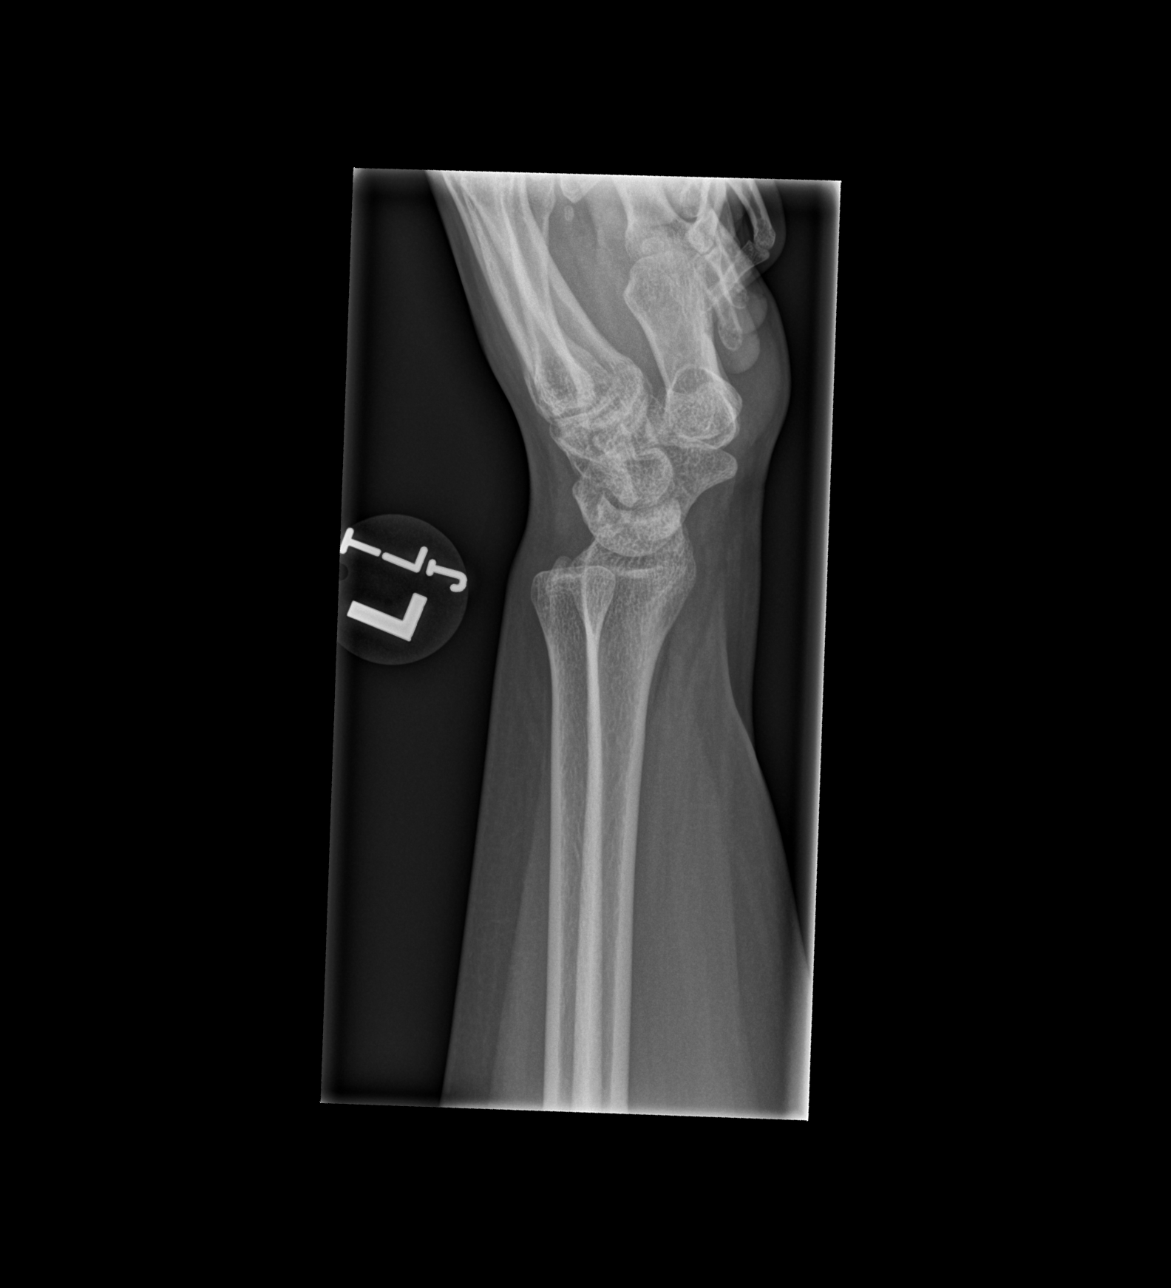

[x wrist lat left (2 of 2)]
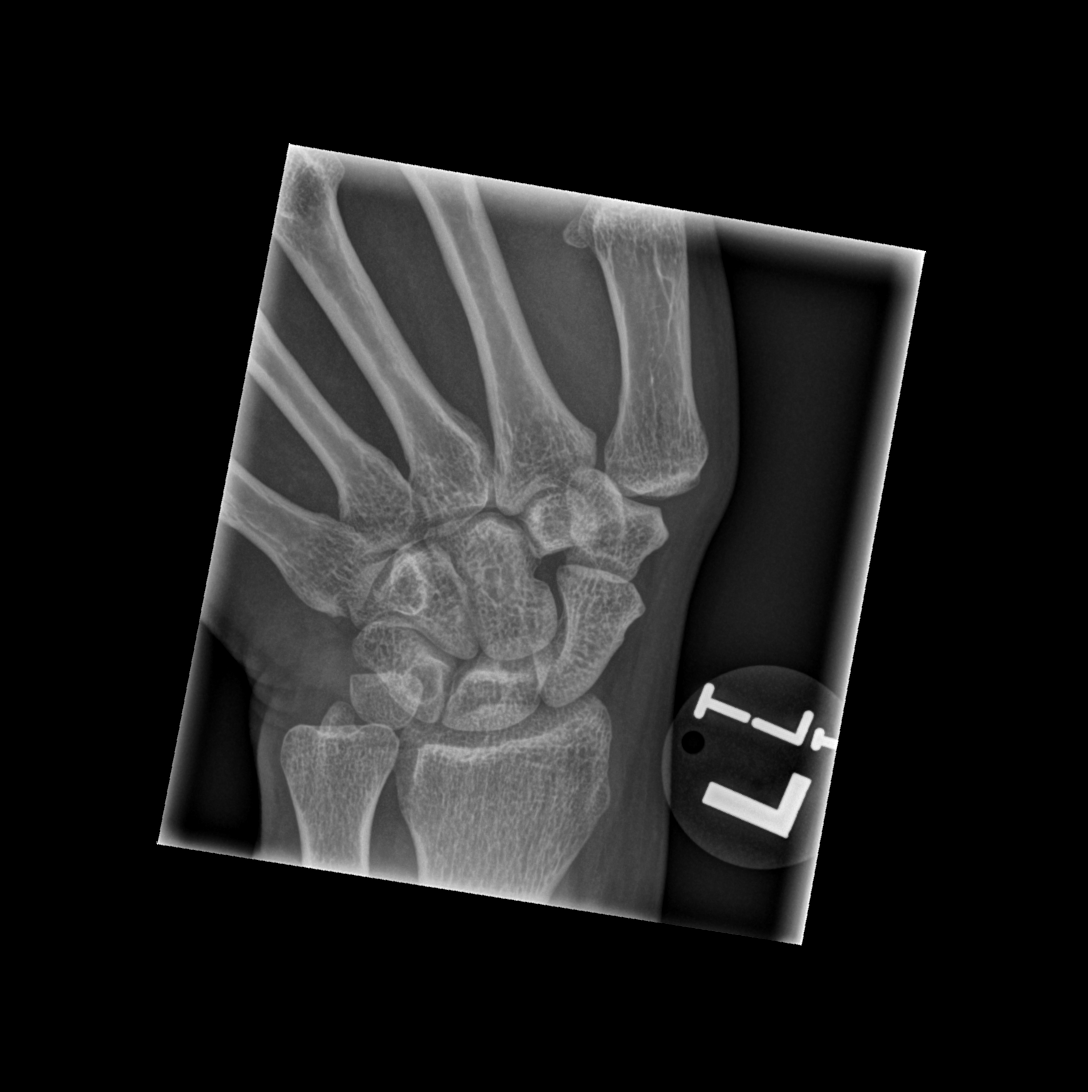

[4 of 4 positions shown; findings below may reference images not displayed]

FINDINGS: There is no evidence of fracture or dislocation. There is no
evidence of arthropathy or other focal bone abnormality. Soft
tissues are unremarkable.
IMPRESSION: Negative.

## 2022-10-02 IMAGING — CR DG WRIST COMPLETE 3+V*R*
4 series · 4 of 4 positions shown · non-contrast
Comparison: None.

CLINICAL DATA: Pain

EXAM:
RIGHT WRIST - COMPLETE 3+ VIEW

[x wrist pa right]
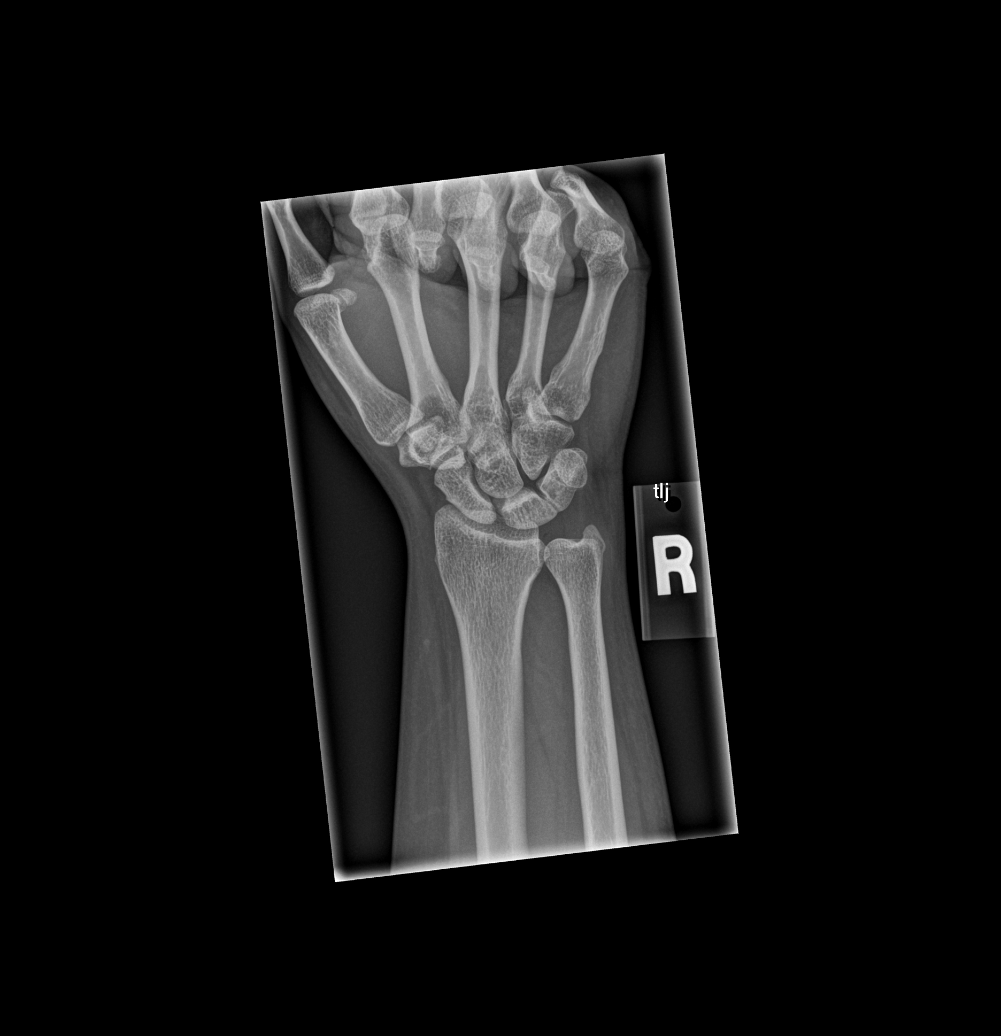

[x wrist obl right]
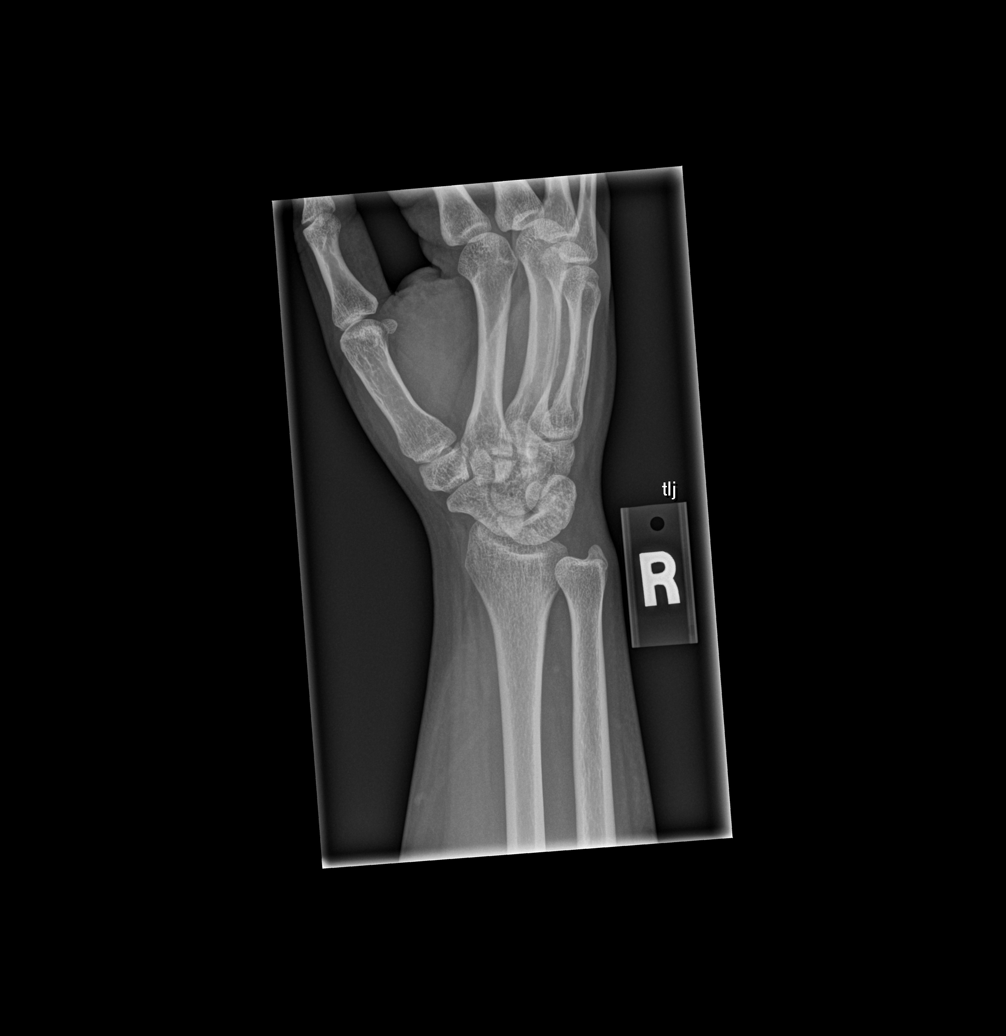

[x wrist lat right (1 of 2)]
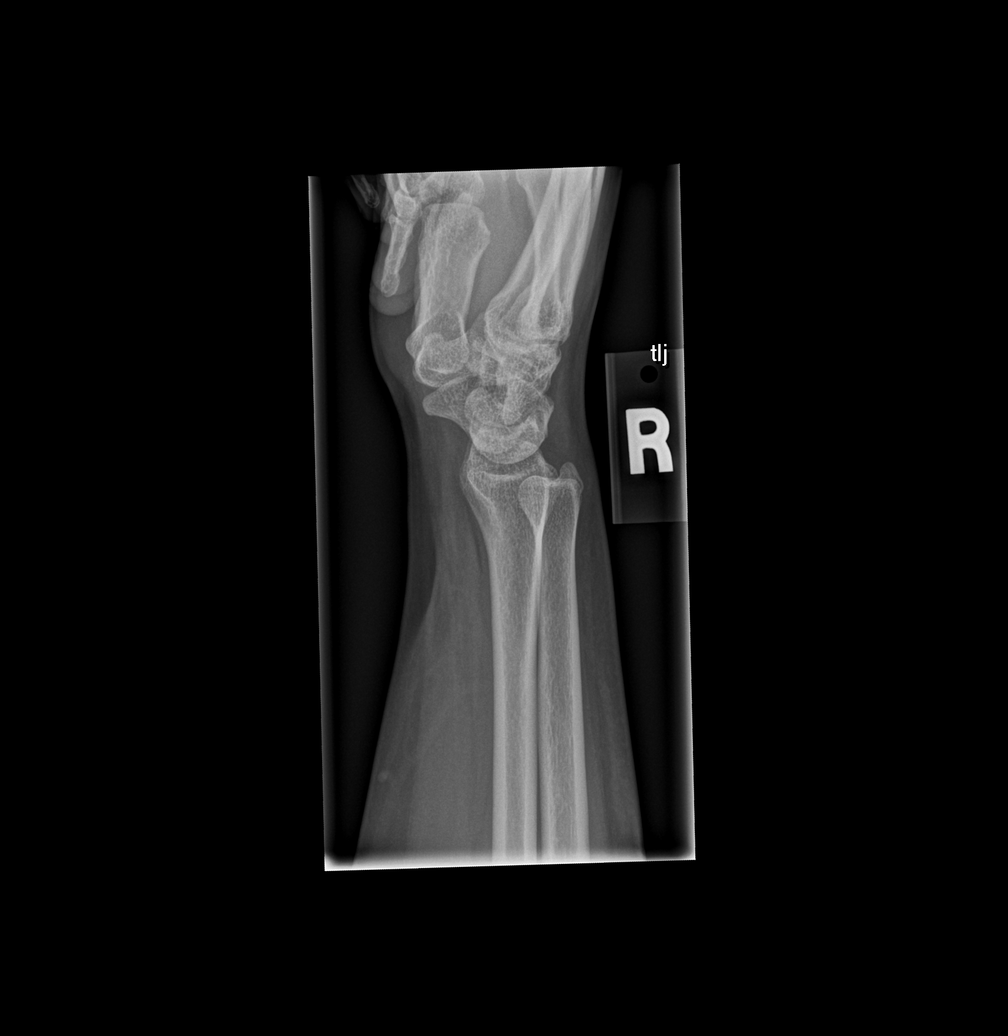

[x wrist lat right (2 of 2)]
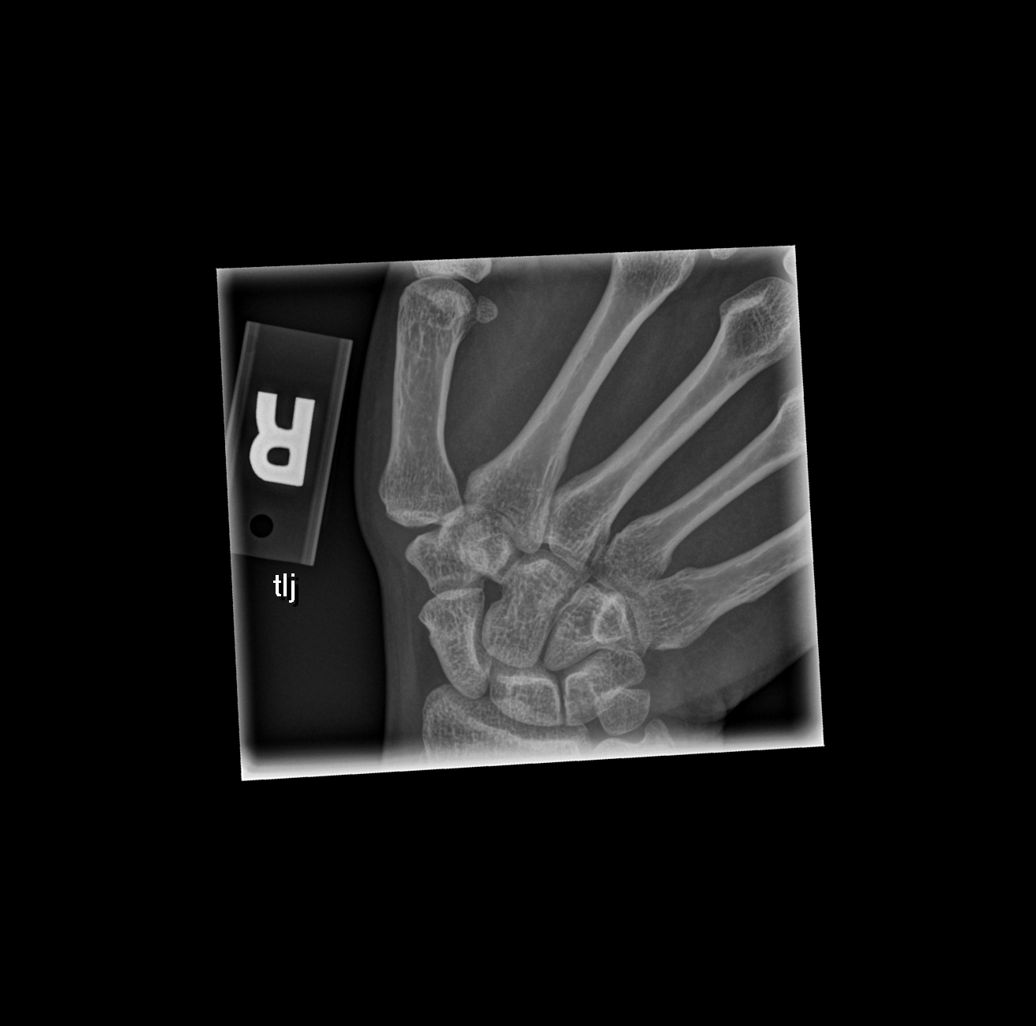

[4 of 4 positions shown; findings below may reference images not displayed]

FINDINGS: There is no evidence of fracture or dislocation. There is no
evidence of arthropathy or other focal bone abnormality. Soft
tissues are unremarkable.
IMPRESSION: Negative.
